# Patient Record
Sex: Male | Born: 1995 | Race: Black or African American | Hispanic: No | Marital: Single | Smoking: Never smoker
Health system: Southern US, Community
[De-identification: ages and names within clinical notes are randomized; demographics above are authoritative.]

## PROBLEM LIST (undated history)

## (undated) DIAGNOSIS — J45909 Unspecified asthma, uncomplicated: Secondary | ICD-10-CM

## (undated) DIAGNOSIS — N289 Disorder of kidney and ureter, unspecified: Secondary | ICD-10-CM

---
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
## 2015-01-20 DIAGNOSIS — I10 Essential (primary) hypertension: Secondary | ICD-10-CM | POA: Insufficient documentation

## 2017-04-06 ENCOUNTER — Encounter (HOSPITAL_COMMUNITY): Payer: Self-pay | Admitting: Emergency Medicine

## 2017-04-06 ENCOUNTER — Emergency Department (HOSPITAL_COMMUNITY): Payer: Medicaid Other

## 2017-04-06 ENCOUNTER — Emergency Department (HOSPITAL_COMMUNITY)
Admission: EM | Admit: 2017-04-06 | Discharge: 2017-04-06 | Disposition: A | Discharge status: Home / Self Care | Payer: Medicaid Other | Attending: Emergency Medicine | Admitting: Emergency Medicine

## 2017-04-06 DIAGNOSIS — J45909 Unspecified asthma, uncomplicated: Secondary | ICD-10-CM | POA: Diagnosis not present

## 2017-04-06 DIAGNOSIS — Z79899 Other long term (current) drug therapy: Secondary | ICD-10-CM | POA: Insufficient documentation

## 2017-04-06 DIAGNOSIS — F17200 Nicotine dependence, unspecified, uncomplicated: Secondary | ICD-10-CM | POA: Diagnosis not present

## 2017-04-06 DIAGNOSIS — R112 Nausea with vomiting, unspecified: Secondary | ICD-10-CM

## 2017-04-06 DIAGNOSIS — R1011 Right upper quadrant pain: Secondary | ICD-10-CM

## 2017-04-06 HISTORY — DX: Disorder of kidney and ureter, unspecified: N28.9

## 2017-04-06 LAB — COMPREHENSIVE METABOLIC PANEL
ALT: 23 U/L (ref 17–63)
AST: 31 U/L (ref 15–41)
Albumin: 4.6 g/dL (ref 3.5–5.0)
Alkaline Phosphatase: 119 U/L (ref 38–126)
Anion gap: 13 (ref 5–15)
BILIRUBIN TOTAL: 1.8 mg/dL — AB (ref 0.3–1.2)
BUN: 17 mg/dL (ref 6–20)
CO2: 23 mmol/L (ref 22–32)
CREATININE: 1.92 mg/dL — AB (ref 0.61–1.24)
Calcium: 10 mg/dL (ref 8.9–10.3)
Chloride: 99 mmol/L — ABNORMAL LOW (ref 101–111)
GFR calc Af Amer: 56 mL/min — ABNORMAL LOW (ref >60)
GFR calc non Af Amer: 49 mL/min — ABNORMAL LOW (ref >60)
Glucose, Bld: 107 mg/dL — ABNORMAL HIGH (ref 65–99)
Potassium: 3.9 mmol/L (ref 3.5–5.1)
Sodium: 135 mmol/L (ref 135–145)
TOTAL PROTEIN: 8.5 g/dL — AB (ref 6.5–8.1)

## 2017-04-06 LAB — URINALYSIS, ROUTINE W REFLEX MICROSCOPIC
BACTERIA UA: NONE SEEN
BILIRUBIN URINE: NEGATIVE
Glucose, UA: NEGATIVE mg/dL
KETONES UR: 20 mg/dL — AB
LEUKOCYTES UA: NEGATIVE
NITRITE: NEGATIVE
Protein, ur: >=300 mg/dL — AB
Specific Gravity, Urine: 1.018 (ref 1.005–1.030)
Squamous Epithelial / LPF: NONE SEEN
pH: 5 (ref 5.0–8.0)

## 2017-04-06 LAB — CBC
HCT: 57.3 % — ABNORMAL HIGH (ref 39.0–52.0)
Hemoglobin: 20.5 g/dL — ABNORMAL HIGH (ref 13.0–17.0)
MCH: 30.7 pg (ref 26.0–34.0)
MCHC: 35.8 g/dL (ref 30.0–36.0)
MCV: 85.8 fL (ref 78.0–100.0)
PLATELETS: 199 10*3/uL (ref 150–400)
RBC: 6.68 MIL/uL — ABNORMAL HIGH (ref 4.22–5.81)
RDW: 13.2 % (ref 11.5–15.5)
WBC: 10.3 10*3/uL (ref 4.0–10.5)

## 2017-04-06 LAB — LIPASE, BLOOD: Lipase: 26 U/L (ref 11–51)

## 2017-04-06 MED ORDER — ONDANSETRON 4 MG PO TBDP: 4.0000 mg | ORAL_TABLET | Freq: Three times a day (TID) | ORAL | 0 refills | Status: DC | PRN

## 2017-04-06 MED ORDER — GI COCKTAIL ~~LOC~~
30.0000 mL | Freq: Once | ORAL | Status: AC
  Administered 2017-04-06: 30 mL via ORAL
  Filled 2017-04-06: qty 30

## 2017-04-06 MED ORDER — LACTATED RINGERS IV BOLUS (SEPSIS)
1000.0000 mL | Freq: Once | INTRAVENOUS | Status: AC
  Administered 2017-04-06: 1000 mL via INTRAVENOUS

## 2017-04-06 MED ORDER — ONDANSETRON HCL 4 MG/2ML IJ SOLN
4.0000 mg | Freq: Once | INTRAMUSCULAR | Status: AC
  Administered 2017-04-06: 4 mg via INTRAVENOUS
  Filled 2017-04-06: qty 2

## 2017-04-06 MED ORDER — FENTANYL CITRATE (PF) 100 MCG/2ML IJ SOLN
100.0000 ug | Freq: Once | INTRAMUSCULAR | Status: AC
  Administered 2017-04-06: 100 ug via INTRAVENOUS
  Filled 2017-04-06: qty 2

## 2017-04-06 MED ORDER — RANITIDINE HCL 150 MG PO CAPS: 150.0000 mg | ORAL_CAPSULE | Freq: Every day | ORAL | 0 refills | Status: DC

## 2017-04-06 NOTE — ED Notes (Signed)
Gave pt a urinal 

## 2017-04-06 NOTE — ED Notes (Signed)
This RN called to consult room by phlebotomist regarding patient status. Patient diaphoretic upon assessment c/o abdominal pain and chills. Vital signs obtained.

## 2017-04-06 NOTE — ED Triage Notes (Signed)
Per EMS, patient c/o abdominal pain with N/V since yesterday morning. Denies chest pain and SOB. Ambulatory with EMS.  BP 146/105 RR 20 CBG 127

## 2017-04-06 NOTE — ED Provider Notes (Signed)
[facility]   CSN: [id number] Arrival date & time: 04/06/17  1458     History   Chief Complaint Chief Complaint  Patient presents with  . Abdominal Pain    HPI [name] is a 21 y.o. male.  Vernona [name] w/ PMH including HTN, renal disorder who p/w abdominal pain and vomiting. The patient began having right upper quadrant abdominal pain yesterday that has been constant since it began. He has had her current episodes of nausea and vomiting since yesterday and states that he has only been able to tolerate ginger ale, has vomiting with any food intake. He denies any radiation of the pain to his shoulder or back. He reports associated chills and was noted to be diaphoretic in triage. He denies any known fevers. No diarrhea or urinary problems. He denies any sick contacts.  The patient reports that he follows with [facility] for his renal issues and states that he is compliant with his medications.   The history is provided by the patient.  Abdominal Pain      Past Medical History:  Diagnosis Date  . Asthma   . Renal disorder     There are no active problems to display for this patient.   History reviewed. No pertinent surgical history.     Home Medications    Prior to Admission medications   Medication Sig Start Date End Date Taking? Authorizing Provider  losartan (COZAAR) 50 MG tablet Take 1 tablet by mouth daily. 12/23/14  Yes [provider]  Vitamin D, Ergocalciferol, (DRISDOL) 50000 units CAPS capsule Take 1 capsule by mouth once a week. 03/04/17  Yes [provider]  ondansetron (ZOFRAN ODT) 4 MG disintegrating tablet Take 1 tablet (4 mg total) by mouth every 8 (eight) hours as needed for nausea or vomiting. 04/06/17   Edom Schmuhl, Ambrose Finland, MD  ondansetron (ZOFRAN) 4 MG tablet Take 1 tablet (4 mg total) by mouth every 6 (six) hours. Patient not taking: Reported on 04/06/2017 09/23/14   Phillis Haggis, MD  ranitidine  (ZANTAC) 150 MG capsule Take 1 capsule (150 mg total) by mouth daily. 04/06/17   Nicolaas Savo, Ambrose Finland, MD    Family History No family history on file.  Social History Social History  Substance Use Topics  . Smoking status: Current Every Day Smoker  . Smokeless tobacco: Not on file  . Alcohol use No     Allergies   Amoxicillin   Review of Systems Review of Systems  Gastrointestinal: Positive for abdominal pain.   All other systems reviewed and are negative except that which was mentioned in HPI   Physical Exam Updated Vital Signs BP (!) 155/111 (BP Location: Right Arm)   Pulse 88   Temp 98.3 F (36.8 C) (Oral)   Resp 18   SpO2 98%   Physical Exam  Constitutional: He is oriented to person, place, and time. He appears well-developed and well-nourished. No distress.  HENT:  Head: Normocephalic and atraumatic.  Moist mucous membranes  Eyes: Pupils are equal, round, and reactive to light. Conjunctivae are normal.  Neck: Neck supple.  Cardiovascular: Normal rate, regular rhythm and normal heart sounds.   No murmur heard. Pulmonary/Chest: Effort normal and breath sounds normal.  Abdominal: Soft. Bowel sounds are normal. He exhibits no distension. There is tenderness (RUQ). There is no rebound, no guarding and negative Murphy's sign.  Musculoskeletal: He exhibits no edema.  Neurological: He is alert and oriented to person, place, and time.  Fluent speech  Skin: Skin is warm. He is diaphoretic.  Psychiatric: He has a normal mood and affect. Judgment normal.  Nursing note and vitals reviewed.    ED Treatments / Results  Labs (all labs ordered are listed, but only abnormal results are displayed) Labs Reviewed  COMPREHENSIVE METABOLIC PANEL - Abnormal; Notable for the following:       Result Value   Chloride 99 (*)    Glucose, Bld 107 (*)    Creatinine, Ser 1.92 (*)    Total Protein 8.5 (*)    Total Bilirubin 1.8 (*)    GFR calc non Af Amer 49 (*)    GFR calc Af  Amer 56 (*)    All other components within normal limits  CBC - Abnormal; Notable for the following:    RBC 6.68 (*)    Hemoglobin 20.5 (*)    HCT 57.3 (*)    All other components within normal limits  URINALYSIS, ROUTINE W REFLEX MICROSCOPIC - Abnormal; Notable for the following:    Hgb urine dipstick SMALL (*)    Ketones, ur 20 (*)    Protein, ur >=300 (*)    All other components within normal limits  LIPASE, BLOOD    EKG  EKG Interpretation None       Radiology US Abdomen Limited Ruq  Result Date: 04/06/2017 CLINICAL DATA:  Right upper quadrant pain EXAM: ULTRASOUND ABDOMEN LIMITED RIGHT UPPER QUADRANT COMPARISON:  11/28/2014 FINDINGS: Gallbladder: No gallstones or wall thickening visualized. No sonographic Murphy sign noted by sonographer. Common bile duct: Diameter: 2.7 mm Liver: No focal lesion identified. Within normal limits in parenchymal echogenicity. Portal vein is patent on color Doppler imaging with normal direction of blood flow towards the liver. Incidental note made of echogenic right kidney IMPRESSION: 1. Negative for gallstones or biliary dilatation 2. Incidental note made of echogenic right kidney, could be due to pyelonephritis or medical renal disease. Could evaluate with dedicated renal ultrasound Electronically Signed   By: Jasmine Pang M.D.   On: 04/06/2017 20:39    Procedures Procedures (including critical care time)  Medications Ordered in ED Medications  lactated ringers bolus 1,000 mL (0 mLs Intravenous Stopped 04/06/17 2045)  ondansetron (ZOFRAN) injection 4 mg (4 mg Intravenous Given 04/06/17 1908)  fentaNYL (SUBLIMAZE) injection 100 mcg (100 mcg Intravenous Given 04/06/17 1910)  gi cocktail (Maalox,Lidocaine,Donnatal) (30 mLs Oral Given 04/06/17 2112)     Initial Impression / Assessment and Plan / ED Course  I have reviewed the triage vital signs and the nursing notes.  Pertinent labs & imaging results that were available during my care of the  patient were reviewed by me and considered in my medical decision making (see chart for details).     Pt w/ RUQ pain and N/V since last night. He was diaphoretic but non-toxic on exam, VS notable for mild tachycardia but afebrile. Focal RUQ tenderness on exam. Labs show creatinine 1.92 which is mildly worse than previous labs from 2016, no recent for comparison. AST/ALT normal, total bilirubin elevated at 1.8. His white count is normal, he does have hemoglobin of 20.5 which is similar to elevated values previously. Obtained RUQ [place] which was unremarkable.  On repeat exam after receiving above medications including GI cocktail, the patient stated that his pain had completely resolved. He was well-appearing and had been able to tolerate liquids, wanted to go home. I did start him on H2 blocker and discussed f/u with PCP regarding sx. reviewed return precautions including  worsening pain, vomiting, fever, or bloody stools. Patient voiced understanding and was discharged in satisfactory condition. Final Clinical Impressions(s) / ED Diagnoses   Final diagnoses:  RUQ pain  Right upper quadrant abdominal pain  Non-intractable vomiting with nausea, unspecified vomiting type    New Prescriptions Discharge Medication List as of 04/06/2017 10:16 PM    START taking these medications   Details  ondansetron (ZOFRAN ODT) 4 MG disintegrating tablet Take 1 tablet (4 mg total) by mouth every 8 (eight) hours as needed for nausea or vomiting., Starting Thu 04/06/2017, Print    ranitidine (ZANTAC) 150 MG capsule Take 1 capsule (150 mg total) by mouth daily., Starting Thu 04/06/2017, Print         Caroll Weinheimer, Ambrose Finland, MD 04/08/17 [facility]ers

## 2017-08-11 ENCOUNTER — Encounter (HOSPITAL_COMMUNITY): Payer: Self-pay | Admitting: Emergency Medicine

## 2017-08-11 ENCOUNTER — Emergency Department (HOSPITAL_COMMUNITY)
Admission: EM | Admit: 2017-08-11 | Discharge: 2017-08-11 | Disposition: A | Discharge status: Home / Self Care | Payer: Medicaid Other | Attending: Emergency Medicine | Admitting: Emergency Medicine

## 2017-08-11 ENCOUNTER — Other Ambulatory Visit: Payer: Self-pay

## 2017-08-11 DIAGNOSIS — R809 Proteinuria, unspecified: Secondary | ICD-10-CM | POA: Insufficient documentation

## 2017-08-11 DIAGNOSIS — F17210 Nicotine dependence, cigarettes, uncomplicated: Secondary | ICD-10-CM | POA: Diagnosis not present

## 2017-08-11 DIAGNOSIS — R1013 Epigastric pain: Secondary | ICD-10-CM | POA: Insufficient documentation

## 2017-08-11 DIAGNOSIS — Z79899 Other long term (current) drug therapy: Secondary | ICD-10-CM | POA: Insufficient documentation

## 2017-08-11 DIAGNOSIS — R110 Nausea: Secondary | ICD-10-CM | POA: Insufficient documentation

## 2017-08-11 LAB — CBC WITH DIFFERENTIAL/PLATELET
BASOS ABS: 0 10*3/uL (ref 0.0–0.1)
BASOS PCT: 0 %
EOS ABS: 0.1 10*3/uL (ref 0.0–0.7)
EOS PCT: 1 %
HCT: 52 % (ref 39.0–52.0)
Hemoglobin: 18.9 g/dL — ABNORMAL HIGH (ref 13.0–17.0)
Lymphocytes Relative: 37 %
Lymphs Abs: 2.9 10*3/uL (ref 0.7–4.0)
MCH: 31 pg (ref 26.0–34.0)
MCHC: 36.3 g/dL — ABNORMAL HIGH (ref 30.0–36.0)
MCV: 85.2 fL (ref 78.0–100.0)
MONO ABS: 0.5 10*3/uL (ref 0.1–1.0)
Monocytes Relative: 7 %
NEUTROS ABS: 4.2 10*3/uL (ref 1.7–7.7)
Neutrophils Relative %: 55 %
Platelets: 178 10*3/uL (ref 150–400)
RBC: 6.1 MIL/uL — ABNORMAL HIGH (ref 4.22–5.81)
RDW: 13.6 % (ref 11.5–15.5)
WBC: 7.8 10*3/uL (ref 4.0–10.5)

## 2017-08-11 LAB — COMPREHENSIVE METABOLIC PANEL
ALBUMIN: 4 g/dL (ref 3.5–5.0)
ALT: 25 U/L (ref 17–63)
AST: 26 U/L (ref 15–41)
Alkaline Phosphatase: 100 U/L (ref 38–126)
Anion gap: 6 (ref 5–15)
BUN: 12 mg/dL (ref 6–20)
CO2: 25 mmol/L (ref 22–32)
Calcium: 9.2 mg/dL (ref 8.9–10.3)
Chloride: 107 mmol/L (ref 101–111)
Creatinine, Ser: 1.47 mg/dL — ABNORMAL HIGH (ref 0.61–1.24)
GFR calc Af Amer: >60 mL/min (ref >60)
GFR calc non Af Amer: >60 mL/min (ref >60)
Glucose, Bld: 91 mg/dL (ref 65–99)
POTASSIUM: 3.9 mmol/L (ref 3.5–5.1)
SODIUM: 138 mmol/L (ref 135–145)
Total Bilirubin: 0.5 mg/dL (ref 0.3–1.2)
Total Protein: 7.3 g/dL (ref 6.5–8.1)

## 2017-08-11 LAB — LIPASE, BLOOD: LIPASE: 44 U/L (ref 11–51)

## 2017-08-11 LAB — URINALYSIS, ROUTINE W REFLEX MICROSCOPIC
BILIRUBIN URINE: NEGATIVE
Bacteria, UA: NONE SEEN
Glucose, UA: NEGATIVE mg/dL
HGB URINE DIPSTICK: NEGATIVE
Ketones, ur: NEGATIVE mg/dL
LEUKOCYTES UA: NEGATIVE
NITRITE: NEGATIVE
PH: 6 (ref 5.0–8.0)
Protein, ur: 100 mg/dL — AB
SQUAMOUS EPITHELIAL / LPF: NONE SEEN
Specific Gravity, Urine: 1.015 (ref 1.005–1.030)

## 2017-08-11 LAB — RAPID URINE DRUG SCREEN, HOSP PERFORMED
Amphetamines: NOT DETECTED
BARBITURATES: NOT DETECTED
Benzodiazepines: NOT DETECTED
COCAINE: NOT DETECTED
Opiates: NOT DETECTED
Tetrahydrocannabinol: POSITIVE — AB

## 2017-08-11 MED ORDER — SODIUM CHLORIDE 0.9 % IV BOLUS (SEPSIS)
1000.0000 mL | Freq: Once | INTRAVENOUS | Status: AC
  Administered 2017-08-11: 1000 mL via INTRAVENOUS

## 2017-08-11 MED ORDER — ONDANSETRON 4 MG PO TBDP: 4.0000 mg | ORAL_TABLET | Freq: Three times a day (TID) | ORAL | 0 refills | Status: DC | PRN

## 2017-08-11 MED ORDER — LOSARTAN POTASSIUM 50 MG PO TABS
50.0000 mg | ORAL_TABLET | Freq: Once | ORAL | Status: AC
  Administered 2017-08-11: 50 mg via ORAL
  Filled 2017-08-11: qty 1

## 2017-08-11 MED ORDER — PANTOPRAZOLE SODIUM 40 MG PO TBEC
40.0000 mg | DELAYED_RELEASE_TABLET | Freq: Once | ORAL | Status: AC
  Administered 2017-08-11: 40 mg via ORAL
  Filled 2017-08-11: qty 1

## 2017-08-11 MED ORDER — RANITIDINE HCL 150 MG PO CAPS: 150.0000 mg | ORAL_CAPSULE | Freq: Every day | ORAL | 0 refills | Status: DC

## 2017-08-11 MED ORDER — GI COCKTAIL ~~LOC~~
30.0000 mL | Freq: Once | ORAL | Status: AC
  Administered 2017-08-11: 30 mL via ORAL
  Filled 2017-08-11: qty 30

## 2017-08-11 NOTE — ED Provider Notes (Signed)
[facility]-EMERGENCY DEPT Provider Note   CSN: [id number] Arrival date & time: 08/11/17  0058     History   Chief Complaint Chief Complaint  Patient presents with  . Abdominal Pain    HPI [name] is a 22 y.o. male with a hx of asthma presents to the Emergency Department complaining of [name], [name], [name] reports pain is sharp and constant.  [name] reports breathing sometimes makes it worse and nothing makes it better.  [name] has tried different positions, but has not taken any medications for this.  He does take Losartan daily (except for today).  [name] denies vomiting, diarrhea, weakness, dizziness, syncope, fever, chills, dysuria, hematuria.   He denies recent travel, abd surgeries or sick contacts.  Patient reports he does regularly smoke marijuana.   The history is provided by the patient and medical records. No language interpreter was used.    Past Medical History:  Diagnosis Date  . Asthma   . Renal disorder     There are no active problems to display for this patient.   History reviewed. No pertinent surgical history.     Home Medications    Prior to Admission medications   Medication Sig Start Date End Date Taking? Authorizing Provider  losartan (COZAAR) 50 MG tablet Take 1 tablet by mouth daily. 12/23/14  Yes [provider]  Vitamin D, Ergocalciferol, (DRISDOL) 50000 units CAPS capsule Take 1 capsule by mouth once a week. 03/04/17  Yes [provider]  ondansetron (ZOFRAN ODT) 4 MG disintegrating tablet Take 1 tablet (4 mg total) by mouth every 8 (eight) hours as needed for nausea or vomiting. 08/11/17   Champ Keetch, Dahlia Client, PA-C  ranitidine (ZANTAC) 150 MG capsule Take 1 capsule (150 mg total) by mouth daily. 08/11/17   Srihith Aquilino, Dahlia Client, PA-C    Family History No family history on file.  Social History Social History   Tobacco Use  . Smoking status:  Current Every Day Smoker  Substance Use Topics  . Alcohol use: No  . Drug use: No     Allergies   Amoxicillin   Review of Systems Review of Systems  Constitutional: Negative for appetite change, diaphoresis, fatigue, fever and unexpected weight change.  HENT: Negative for mouth sores.   Eyes: Negative for visual disturbance.  Respiratory: Negative for cough, chest tightness, shortness of breath and wheezing.   Cardiovascular: Negative for chest pain.  Gastrointestinal: Positive for abdominal pain and nausea. Negative for constipation, diarrhea and vomiting.  Endocrine: Negative for polydipsia, polyphagia and polyuria.  Genitourinary: Negative for dysuria, frequency, hematuria and urgency.  Musculoskeletal: Negative for back pain and neck stiffness.  Skin: Negative for rash.  Allergic/Immunologic: Negative for immunocompromised state.  Neurological: Negative for syncope, light-headedness and headaches.  Hematological: Does not bruise/bleed easily.  Psychiatric/Behavioral: Negative for sleep disturbance. The patient is not nervous/anxious.      Physical Exam Updated Vital Signs BP (!) 171/120   Pulse 98   Resp 18   SpO2 100%   Physical Exam  Constitutional: He appears well-developed and well-nourished. No distress.  Awake, alert, nontoxic appearance  HENT:  Head: Normocephalic and atraumatic.  Mouth/Throat: Oropharynx is clear and moist. No oropharyngeal exudate.  Eyes: Conjunctivae are normal. No scleral icterus.  Neck: Normal range of motion. Neck supple.  Cardiovascular: Normal rate, regular rhythm and intact distal pulses.  Pulmonary/Chest: Effort normal and breath sounds normal. No respiratory distress. He has no wheezes.  Equal chest expansion  Abdominal: Soft. Bowel sounds are normal. He exhibits no mass. There is tenderness in the epigastric area. There is no rigidity, no rebound, no guarding, no CVA tenderness, no tenderness at McBurney's point and negative  Murphy's sign.  Musculoskeletal: Normal range of motion. He exhibits no edema.  Neurological: He is alert.  Speech is clear and goal oriented Moves extremities without ataxia  Skin: Skin is warm and dry. He is not diaphoretic.  Psychiatric: He has a normal mood and affect.  Nursing note and vitals reviewed.    ED Treatments / Results  Labs (all labs ordered are listed, but only abnormal results are displayed) Labs Reviewed  CBC WITH DIFFERENTIAL/PLATELET - Abnormal; Notable for the following components:      Result Value   RBC 6.10 (*)    Hemoglobin 18.9 (*)    MCHC 36.3 (*)    All other components within normal limits  COMPREHENSIVE METABOLIC PANEL - Abnormal; Notable for the following components:   Creatinine, Ser 1.47 (*)    All other components within normal limits  URINALYSIS, ROUTINE W REFLEX MICROSCOPIC - Abnormal; Notable for the following components:   Protein, ur 100 (*)    All other components within normal limits  RAPID URINE DRUG SCREEN, HOSP PERFORMED - Abnormal; Notable for the following components:   Tetrahydrocannabinol POSITIVE (*)    All other components within normal limits  LIPASE, BLOOD     Procedures Procedures (including critical care time)  Medications Ordered in ED Medications  gi cocktail (Maalox,Lidocaine,Donnatal) (30 mLs Oral Given 08/11/17 0456)  pantoprazole (PROTONIX) EC tablet 40 mg (40 mg Oral Given 08/11/17 0456)  sodium chloride 0.9 % bolus 1,000 mL (1,000 mLs Intravenous New Bag/Given 08/11/17 0456)  losartan (COZAAR) tablet 50 mg (50 mg Oral Given 08/11/17 0543)     Initial Impression / Assessment and Plan / ED Course  I have reviewed the triage vital signs and the nursing notes.  Pertinent labs & imaging results that were available during my care of the patient were reviewed by me and considered in my medical decision making (see chart for details).  Clinical Course as of Aug 11 622  Fri Aug 11, 2017  0536 Tetrahydrocannabinol: (!)  POSITIVE [HM]  0536 Baseline Creatinine: (!) 1.47 [HM]  0536 Baseline Hemoglobin: (!) 18.9 [HM]  0536 Proteinuria noted. Protein: (!) 100 [HM]    Clinical Course User Index [HM] Marcel Sorter, Dahlia ClientHannah, PA-C    Patient with symptoms consistent with gastritis.  Likely viral vs cannabinoid in nature.  Vitals are stable, no fever or tachycardia.  Patient is nontoxic, nonseptic appearing, in no apparent distress.  Patient does not meet the SIRS or Sepsis criteria.  [name]'s symptoms have been managed in the department; fluid bolus given.  Patient with some proteinuria noted.  He reports this is baseline.  No signs of dehydration, tolerating PO fluids > 6 oz.  Lungs are clear.  No peritoneal signs, no concern for appendicitis, cholecystitis, pancreatitis, ruptured viscus, UTI, kidney stone.  Supportive therapy indicated.  Patient counseled, expresses understanding and agrees with plan.   BP (!) 173/117   Pulse (!) 101   Resp 18   SpO2 100%    Final Clinical Impressions(s) / ED Diagnoses   Final diagnoses:  Epigastric pain  Proteinuria, unspecified type    ED Discharge Orders        Ordered    ondansetron (ZOFRAN ODT) 4 MG disintegrating tablet  Every 8 hours PRN  08/11/17 0611    ranitidine (ZANTAC) 150 MG capsule  Daily     08/11/17 0611       Tariq Pernell, Dahlia Client, PA-C 08/11/17 0625    Molpus, Jonny Ruiz, MD 08/11/17 [phone]

## 2017-08-11 NOTE — ED Notes (Signed)
Pt stated that he has not taken his BP medication today. He does have a hx of HTN and is on Losartan.

## 2017-08-11 NOTE — Discharge Instructions (Signed)
1. Medications: Zofran, Zantac, usual home medications including BP meds 2. Treatment: rest, drink plenty of fluids, advance diet slowly 3. Follow Up: Please followup with your primary doctor in 2 days for discussion of your diagnoses and further evaluation after today's visit and repeat urinalysis; if you do not have a primary care doctor use the resource guide provided to find one; Please return to the ER for persistent vomiting, high fevers or worsening symptoms

## 2017-08-11 NOTE — ED Triage Notes (Signed)
Pt arriving with [facility] with complaint of upper abdominal pain x2 days. Denies N/V/D. Pt states the pain increases when he inhales and when the area is palpated.

## 2018-07-30 ENCOUNTER — Encounter (HOSPITAL_COMMUNITY): Payer: Self-pay | Admitting: *Deleted

## 2018-07-30 ENCOUNTER — Emergency Department (HOSPITAL_COMMUNITY)
Admission: EM | Admit: 2018-07-30 | Discharge: 2018-07-30 | Disposition: A | Discharge status: Home / Self Care | Payer: Medicaid Other | Attending: Emergency Medicine | Admitting: Emergency Medicine

## 2018-07-30 ENCOUNTER — Emergency Department (HOSPITAL_COMMUNITY): Payer: Medicaid Other

## 2018-07-30 DIAGNOSIS — F17210 Nicotine dependence, cigarettes, uncomplicated: Secondary | ICD-10-CM | POA: Diagnosis not present

## 2018-07-30 DIAGNOSIS — Z79899 Other long term (current) drug therapy: Secondary | ICD-10-CM | POA: Insufficient documentation

## 2018-07-30 DIAGNOSIS — M545 Low back pain, unspecified: Secondary | ICD-10-CM

## 2018-07-30 DIAGNOSIS — B349 Viral infection, unspecified: Secondary | ICD-10-CM | POA: Diagnosis not present

## 2018-07-30 DIAGNOSIS — R05 Cough: Secondary | ICD-10-CM | POA: Diagnosis present

## 2018-07-30 DIAGNOSIS — J439 Emphysema, unspecified: Secondary | ICD-10-CM | POA: Insufficient documentation

## 2018-07-30 DIAGNOSIS — J45909 Unspecified asthma, uncomplicated: Secondary | ICD-10-CM | POA: Diagnosis not present

## 2018-07-30 LAB — COMPREHENSIVE METABOLIC PANEL
ALK PHOS: 107 U/L (ref 38–126)
ALT: 25 U/L (ref 0–44)
AST: 30 U/L (ref 15–41)
Albumin: 4.1 g/dL (ref 3.5–5.0)
Anion gap: 9 (ref 5–15)
BUN: 13 mg/dL (ref 6–20)
CALCIUM: 9.4 mg/dL (ref 8.9–10.3)
CO2: 24 mmol/L (ref 22–32)
CREATININE: 1.67 mg/dL — AB (ref 0.61–1.24)
Chloride: 105 mmol/L (ref 98–111)
GFR, EST NON AFRICAN AMERICAN: 57 mL/min — AB (ref >60)
Glucose, Bld: 74 mg/dL (ref 70–99)
Potassium: 3.9 mmol/L (ref 3.5–5.1)
Sodium: 138 mmol/L (ref 135–145)
Total Bilirubin: 1.1 mg/dL (ref 0.3–1.2)
Total Protein: 7.5 g/dL (ref 6.5–8.1)

## 2018-07-30 LAB — CBC WITH DIFFERENTIAL/PLATELET
ABS IMMATURE GRANULOCYTES: 0.01 10*3/uL (ref 0.00–0.07)
BASOS PCT: 1 %
Basophils Absolute: 0.1 10*3/uL (ref 0.0–0.1)
Eosinophils Absolute: 0 10*3/uL (ref 0.0–0.5)
Eosinophils Relative: 0 %
HEMATOCRIT: 58 % — AB (ref 39.0–52.0)
HEMOGLOBIN: 19.2 g/dL — AB (ref 13.0–17.0)
IMMATURE GRANULOCYTES: 0 %
LYMPHS PCT: 13 %
Lymphs Abs: 0.9 10*3/uL (ref 0.7–4.0)
MCH: 29.4 pg (ref 26.0–34.0)
MCHC: 33.1 g/dL (ref 30.0–36.0)
MCV: 88.7 fL (ref 80.0–100.0)
MONO ABS: 1 10*3/uL (ref 0.1–1.0)
MONOS PCT: 16 %
NEUTROS ABS: 4.4 10*3/uL (ref 1.7–7.7)
NEUTROS PCT: 70 %
Platelets: 221 10*3/uL (ref 150–400)
RBC: 6.54 MIL/uL — AB (ref 4.22–5.81)
RDW: 13.1 % (ref 11.5–15.5)
WBC: 6.4 10*3/uL (ref 4.0–10.5)
nRBC: 0 % (ref 0.0–0.2)

## 2018-07-30 LAB — LIPASE, BLOOD: LIPASE: 42 U/L (ref 11–51)

## 2018-07-30 MED ORDER — SODIUM CHLORIDE 0.9 % IV BOLUS
1000.0000 mL | Freq: Once | INTRAVENOUS | Status: AC
  Administered 2018-07-30: 1000 mL via INTRAVENOUS

## 2018-07-30 MED ORDER — AEROCHAMBER PLUS FLO-VU MEDIUM MISC
1.0000 | Freq: Once | Status: AC
  Administered 2018-07-30: 1
  Filled 2018-07-30: qty 1

## 2018-07-30 MED ORDER — ACETAMINOPHEN 500 MG PO TABS
1000.0000 mg | ORAL_TABLET | Freq: Once | ORAL | Status: AC
  Administered 2018-07-30: 1000 mg via ORAL
  Filled 2018-07-30: qty 2

## 2018-07-30 MED ORDER — NAPROXEN 500 MG PO TABS: 500.0000 mg | ORAL_TABLET | Freq: Once | ORAL | Status: DC

## 2018-07-30 MED ORDER — METHOCARBAMOL 500 MG PO TABS: 500.0000 mg | ORAL_TABLET | Freq: Every evening | ORAL | 0 refills | Status: DC | PRN

## 2018-07-30 MED ORDER — ALBUTEROL SULFATE HFA 108 (90 BASE) MCG/ACT IN AERS
1.0000 | INHALATION_SPRAY | Freq: Once | RESPIRATORY_TRACT | Status: AC
  Administered 2018-07-30: 1 via RESPIRATORY_TRACT
  Filled 2018-07-30: qty 6.7

## 2018-07-30 NOTE — ED Notes (Signed)
Called lab about status of pts blood. Per lab: they just found blood and running the sample at this time. Pts blood collected and sent to lab at 1754

## 2018-07-30 NOTE — ED Provider Notes (Signed)
[facility]-EMERGENCY DEPT Provider Note   CSN: [id number] Arrival date & time: 07/30/18  1520     History   Chief Complaint Chief Complaint  Patient presents with  . Abdominal Pain    HPI [name] is a 23 y.o. male presenting for evaluation of back pain, cough, nausea.  Patient states 2 weeks ago, he developed cough, sore throat, nasal congestion.  Since then, he has had continued nausea with 2 episodes of vomiting.  Additionally, 2 weeks ago he bent forward to pick something up at work, and he had acute onset low back pain.  This has been persistent since.  Patient states today he got very anxious about what was going on, felt like his heart was racing and fell onto the couch.  He denies full loss of consciousness.  Patient denies tobacco, alcohol, or drug use.  He has not taken anything for his symptoms including Tylenol or ibuprofen.  Patient reports a history of kidney problems, for which he is supposed to be taking losartan daily, but does not.  Patient states he has no other medical problems, takes no medications daily.  Patient reports his back pain is present when he moves, specifically bending forward to pick something up.  He denies radiation to his legs.  He denies fevers, chills, rash, history of cancer, history of IVDU, numbness, tingling, or loss of bowel bladder control.  He denies urinary symptoms including dysuria, hematuria, urinary frequency.  HPI  Past Medical History:  Diagnosis Date  . Asthma   . Renal disorder     There are no active problems to display for this patient.   History reviewed. No pertinent surgical history.      Home Medications    Prior to Admission medications   Medication Sig Start Date End Date Taking? Authorizing Provider  losartan (COZAAR) 50 MG tablet Take 1 tablet by mouth once a week.  12/23/14  Yes [provider]  Vitamin D, Ergocalciferol, (DRISDOL) 50000 units CAPS capsule Take 1 capsule by  mouth once a week. 03/04/17  Yes [provider]  methocarbamol (ROBAXIN) 500 MG tablet Take 1 tablet (500 mg total) by mouth at bedtime as needed for muscle spasms. 07/30/18   Wetona Viramontes, PA-C  ondansetron (ZOFRAN ODT) 4 MG disintegrating tablet Take 1 tablet (4 mg total) by mouth every 8 (eight) hours as needed for nausea or vomiting. Patient not taking: Reported on 07/30/2018 08/11/17   Muthersbaugh, Dahlia Client, PA-C  ranitidine (ZANTAC) 150 MG capsule Take 1 capsule (150 mg total) by mouth daily. Patient not taking: Reported on 07/30/2018 08/11/17   Muthersbaugh, Dahlia Client, PA-C    Family History No family history on file.  Social History Social History   Tobacco Use  . Smoking status: Current Every Day Smoker  Substance Use Topics  . Alcohol use: No  . Drug use: No     Allergies   Amoxicillin   Review of Systems Review of Systems  HENT: Positive for congestion (improved) and sore throat (improved).   Respiratory: Positive for cough.   Gastrointestinal: Positive for nausea and vomiting (x2 ).  Musculoskeletal: Positive for back pain.  Psychiatric/Behavioral: The patient is nervous/anxious.   All other systems reviewed and are negative.    Physical Exam Updated Vital Signs BP (!) 141/93 (BP Location: Left Arm)   Pulse 86   Temp 98.1 F (36.7 C) (Oral)   Resp 17   SpO2 99%   Physical Exam Vitals signs and nursing note  reviewed.  Constitutional:      General: He is not in acute distress.    Appearance: He is well-developed.     Comments: Sitting comfortably in the bed in no acute distress  HENT:     Head: Normocephalic and atraumatic.     Comments: MM dry.  OP clear without tonsillar swelling exudate.  Uvula midline with palate rise.  TMs nonerythematous nonbulging bilaterally.    Mouth/Throat:     Mouth: Mucous membranes are dry.  Eyes:     Conjunctiva/sclera: Conjunctivae normal.     Pupils: Pupils are equal, round, and reactive to light.  Neck:      Musculoskeletal: Normal range of motion and neck supple.  Cardiovascular:     Rate and Rhythm: Normal rate and regular rhythm.     Pulses: Normal pulses.  Pulmonary:     Effort: Pulmonary effort is normal. No respiratory distress.     Breath sounds: Normal breath sounds. No wheezing.     Comments: Speaking in full sentences.  Clear lung sounds in all fields.  Nonproductive cough noted on exam. Abdominal:     General: There is no distension.     Palpations: Abdomen is soft. There is no mass.     Tenderness: There is no abdominal tenderness. There is no guarding or rebound.  Musculoskeletal: Normal range of motion.        General: Tenderness present.     Comments: Strength of upper and lower extremities intact bilaterally.  Radial pedal pulses intact bilaterally.  No pain with straight leg raise.  Patient able to turn in the bed without signs of pain.  No saddle paresthesias. Tenderness palpation bilateral low back musculature without increased pain over midline spine.  No step-offs or deformities.  Skin:    General: Skin is warm and dry.     Capillary Refill: Capillary refill takes less than 2 seconds.  Neurological:     Mental Status: He is alert and oriented to person, place, and time.      ED Treatments / Results  Labs (all labs ordered are listed, but only abnormal results are displayed) Labs Reviewed  CBC WITH DIFFERENTIAL/PLATELET - Abnormal; Notable for the following components:      Result Value   RBC 6.54 (*)    Hemoglobin 19.2 (*)    HCT 58.0 (*)    All other components within normal limits  COMPREHENSIVE METABOLIC PANEL - Abnormal; Notable for the following components:   Creatinine, Ser 1.67 (*)    GFR calc non Af Amer 57 (*)    All other components within normal limits  LIPASE, BLOOD    EKG None  Radiology Dg Chest 2 View  Result Date: 07/30/2018 CLINICAL DATA:  Cough. EXAM: CHEST - 2 VIEW COMPARISON:  06/27/2011 FINDINGS: The heart size and mediastinal  contours are within normal limits. The lungs appear hyperinflated but clear. Coarsened interstitial markings of emphysema noted. The visualized skeletal structures are unremarkable. IMPRESSION: 1. No acute cardiopulmonary abnormalities. 2.  Emphysema (ICD10-J43.9). Electronically Signed   By: Signa Kell M.D.   On: 07/30/2018 17:51    Procedures Procedures (including critical care time)  Medications Ordered in ED Medications  naproxen (NAPROSYN) tablet 500 mg (has no administration in time range)  sodium chloride 0.9 % bolus 1,000 mL (0 mLs Intravenous Stopped 07/30/18 1933)  acetaminophen (TYLENOL) tablet 1,000 mg (1,000 mg Oral Given 07/30/18 1931)  albuterol (PROVENTIL HFA;VENTOLIN HFA) 108 (90 Base) MCG/ACT inhaler 1 puff (1 puff Inhalation  Given 07/30/18 1933)  AEROCHAMBER PLUS FLO-VU MEDIUM MISC 1 each (1 each Other Given 07/30/18 1933)     Initial Impression / Assessment and Plan / ED Course  I have reviewed the triage vital signs and the nursing notes.  Pertinent labs & imaging results that were available during my care of the patient were reviewed by me and considered in my medical decision making (see chart for details).     Patient presenting for evaluation 2-week history of low back pain, cough, nasal congestion, nausea.  Physical examination, he is afebrile not tachycardic.  Appears nontoxic.  Back pain is reproducible with palpation of the musculature, no red flags of back pain.  No signs of neurologic deficit.  Low suspicion for vertebral injury, infection, spinal cord compression, myelopathy, cauda equina syndrome.  Likely MSK, exacerbated by possible viral illness.  Patient's other symptoms are consistent with URI, consider bronchitis as most symptoms are improving except the cough.  As symptoms have been present for 2 weeks, will obtain labs, chest x-ray, and reassess.  Labs without leukocytosis.  Hemoglobin elevated, as is patient's baseline.  Also consistent with  dehydration.  Creatinine is 1.67, around patient's baseline.  Otherwise, labs are reassuring.  Discussed findings with patient, importance of hydration.  Chest x-ray viewed interpreted by me, no pneumonia, pneumothorax, or effusion.  Patient does have emphysema noted on the x-ray, no history of previous.  Discussed findings with patient.  Will give albuterol for cough, and encourage follow-up with pulmonology.  Will give Tylenol and muscle relaxers as needed for back pain.  Encourage follow-up with PCP as needed for further evaluation of his symptoms.  At this time, patient appears safe for discharge.  Return precautions given.  Patient states he understands and agrees to plan.   Final Clinical Impressions(s) / ED Diagnoses   Final diagnoses:  Viral illness  Acute bilateral low back pain without sciatica  Pulmonary emphysema, unspecified emphysema type [facility](HCC)    ED Discharge Orders         Ordered    methocarbamol (ROBAXIN) 500 MG tablet  At bedtime PRN     07/30/18 1942           Alveria ApleyCaccavale, Berenize Gatlin, PA-C 07/30/18 2128    Linwood DibblesKnapp, Jon, MD 08/01/18 1318

## 2018-07-30 NOTE — Discharge Instructions (Signed)
You likely have a virus which is making your back pain worse. This is causing your cough and pain all over.  Use tylenol (1000mg , 2 extra strength pills) 3 times a day for pain control.  Use robaxin as needed for muscle/back pain. Have caution, it may make you tired or groggy. Do not drive while taking this medicine.  Use the inhaler every 4 hours for the next 2 days, after that only as needed for coughing or shortness of breath.  Your xray today showed emphysema. This is uncommon in someone your age, and should be followed up with your primary care doctor and/or a lung doctor.  Make sure you are staying well hydrated with water.  Use heating pads/ice to help with your back pain, do the stretches listed in the paperwork.  If pain or symptoms persist, follow up with your primary care doctor.  Return to the ER with any new, worsening, or concerning symptoms.

## 2018-07-30 NOTE — ED Triage Notes (Signed)
Per EMS, pt complains of upper abdominal pain, bilateral flank pain, and vomiting x 2 weeks. Pt states he lifted something heavy a couple of weeks ago prior to symptoms starting.   BP 138/72 HR 70 RR 18

## 2018-08-02 ENCOUNTER — Emergency Department (HOSPITAL_COMMUNITY)
Admission: EM | Admit: 2018-08-02 | Discharge: 2018-08-02 | Disposition: A | Discharge status: Home / Self Care | Payer: Medicaid Other | Attending: Emergency Medicine | Admitting: Emergency Medicine

## 2018-08-02 ENCOUNTER — Encounter (HOSPITAL_COMMUNITY): Payer: Self-pay

## 2018-08-02 DIAGNOSIS — J45909 Unspecified asthma, uncomplicated: Secondary | ICD-10-CM | POA: Insufficient documentation

## 2018-08-02 DIAGNOSIS — R112 Nausea with vomiting, unspecified: Secondary | ICD-10-CM | POA: Diagnosis not present

## 2018-08-02 DIAGNOSIS — Z79899 Other long term (current) drug therapy: Secondary | ICD-10-CM | POA: Insufficient documentation

## 2018-08-02 DIAGNOSIS — F17200 Nicotine dependence, unspecified, uncomplicated: Secondary | ICD-10-CM | POA: Diagnosis not present

## 2018-08-02 DIAGNOSIS — R1013 Epigastric pain: Secondary | ICD-10-CM

## 2018-08-02 DIAGNOSIS — R1110 Vomiting, unspecified: Secondary | ICD-10-CM | POA: Diagnosis present

## 2018-08-02 LAB — CBC WITH DIFFERENTIAL/PLATELET
Abs Immature Granulocytes: 0.03 10*3/uL (ref 0.00–0.07)
Abs Immature Granulocytes: 0.02 10*3/uL (ref 0.00–0.07)
BASOS ABS: 0 10*3/uL (ref 0.0–0.1)
Basophils Absolute: 0 10*3/uL (ref 0.0–0.1)
Basophils Relative: 0 %
Basophils Relative: 0 %
EOS ABS: 0 10*3/uL (ref 0.0–0.5)
EOS PCT: 0 %
Eosinophils Absolute: 0.2 10*3/uL (ref 0.0–0.5)
Eosinophils Relative: 2 %
HCT: 59.7 % — ABNORMAL HIGH (ref 39.0–52.0)
HEMATOCRIT: 62.2 % — AB (ref 39.0–52.0)
HEMOGLOBIN: 21.1 g/dL — AB (ref 13.0–17.0)
Hemoglobin: 20.1 g/dL — ABNORMAL HIGH (ref 13.0–17.0)
IMMATURE GRANULOCYTES: 0 %
Immature Granulocytes: 0 %
LYMPHS ABS: 1.2 10*3/uL (ref 0.7–4.0)
Lymphocytes Relative: 13 %
Lymphocytes Relative: 24 %
Lymphs Abs: 1.7 10*3/uL (ref 0.7–4.0)
MCH: 29.7 pg (ref 26.0–34.0)
MCH: 30.1 pg (ref 26.0–34.0)
MCHC: 33.9 g/dL (ref 30.0–36.0)
MCHC: 33.7 g/dL (ref 30.0–36.0)
MCV: 87.6 fL (ref 80.0–100.0)
MCV: 89.4 fL (ref 80.0–100.0)
MONOS PCT: 12 %
Monocytes Absolute: 1.1 10*3/uL — ABNORMAL HIGH (ref 0.1–1.0)
Monocytes Absolute: 1.1 10*3/uL — ABNORMAL HIGH (ref 0.1–1.0)
Monocytes Relative: 15 %
Neutro Abs: 6.8 10*3/uL (ref 1.7–7.7)
Neutro Abs: 4.4 10*3/uL (ref 1.7–7.7)
Neutrophils Relative %: 75 %
Neutrophils Relative %: 59 %
Platelets: 219 10*3/uL (ref 150–400)
Platelets: 194 10*3/uL (ref 150–400)
RBC: 7.1 MIL/uL — ABNORMAL HIGH (ref 4.22–5.81)
RBC: 6.68 MIL/uL — ABNORMAL HIGH (ref 4.22–5.81)
RDW: 14.3 % (ref 11.5–15.5)
RDW: 13.7 % (ref 11.5–15.5)
WBC: 9.2 10*3/uL (ref 4.0–10.5)
WBC: 7.4 10*3/uL (ref 4.0–10.5)
nRBC: 0 % (ref 0.0–0.2)
nRBC: 0 % (ref 0.0–0.2)

## 2018-08-02 LAB — URINALYSIS, ROUTINE W REFLEX MICROSCOPIC
Bilirubin Urine: NEGATIVE
GLUCOSE, UA: NEGATIVE mg/dL
Ketones, ur: NEGATIVE mg/dL
Leukocytes, UA: NEGATIVE
NITRITE: NEGATIVE
Protein, ur: >=300 mg/dL — AB
SPECIFIC GRAVITY, URINE: 1.008 (ref 1.005–1.030)
pH: 5 (ref 5.0–8.0)

## 2018-08-02 LAB — COMPREHENSIVE METABOLIC PANEL
ALBUMIN: 3.9 g/dL (ref 3.5–5.0)
ALT: 30 U/L (ref 0–44)
AST: 34 U/L (ref 15–41)
Alkaline Phosphatase: 103 U/L (ref 38–126)
Anion gap: 13 (ref 5–15)
BILIRUBIN TOTAL: 0.9 mg/dL (ref 0.3–1.2)
BUN: 18 mg/dL (ref 6–20)
CALCIUM: 9.2 mg/dL (ref 8.9–10.3)
CO2: 18 mmol/L — ABNORMAL LOW (ref 22–32)
Chloride: 104 mmol/L (ref 98–111)
Creatinine, Ser: 1.59 mg/dL — ABNORMAL HIGH (ref 0.61–1.24)
GFR calc Af Amer: >60 mL/min (ref >60)
GLUCOSE: 99 mg/dL (ref 70–99)
POTASSIUM: 3.7 mmol/L (ref 3.5–5.1)
Sodium: 135 mmol/L (ref 135–145)
TOTAL PROTEIN: 8 g/dL (ref 6.5–8.1)

## 2018-08-02 LAB — LIPASE, BLOOD: Lipase: 38 U/L (ref 11–51)

## 2018-08-02 MED ORDER — FAMOTIDINE 20 MG PO TABS
20.0000 mg | ORAL_TABLET | Freq: Once | ORAL | Status: AC
  Administered 2018-08-02: 20 mg via ORAL
  Filled 2018-08-02: qty 1

## 2018-08-02 MED ORDER — ONDANSETRON HCL 4 MG PO TABS: 4.0000 mg | ORAL_TABLET | Freq: Four times a day (QID) | ORAL | 0 refills | Status: DC

## 2018-08-02 MED ORDER — LIDOCAINE VISCOUS HCL 2 % MT SOLN
15.0000 mL | Freq: Once | OROMUCOSAL | Status: AC
  Administered 2018-08-02: 15 mL via ORAL
  Filled 2018-08-02: qty 15

## 2018-08-02 MED ORDER — SUCRALFATE 1 GM/10ML PO SUSP: 1.0000 g | Freq: Three times a day (TID) | ORAL | 0 refills | Status: DC

## 2018-08-02 MED ORDER — LOSARTAN POTASSIUM 50 MG PO TABS
50.0000 mg | ORAL_TABLET | Freq: Once | ORAL | Status: AC
  Administered 2018-08-02: 50 mg via ORAL
  Filled 2018-08-02: qty 1

## 2018-08-02 MED ORDER — SODIUM CHLORIDE 0.9 % IV BOLUS
1000.0000 mL | Freq: Once | INTRAVENOUS | Status: AC
  Administered 2018-08-02: 1000 mL via INTRAVENOUS

## 2018-08-02 MED ORDER — FAMOTIDINE 20 MG PO TABS: 20.0000 mg | ORAL_TABLET | Freq: Two times a day (BID) | ORAL | 0 refills | Status: DC

## 2018-08-02 MED ORDER — PROMETHAZINE HCL 25 MG/ML IJ SOLN
25.0000 mg | Freq: Once | INTRAMUSCULAR | Status: AC
  Administered 2018-08-02: 25 mg via INTRAVENOUS
  Filled 2018-08-02: qty 1

## 2018-08-02 MED ORDER — ALUM & MAG HYDROXIDE-SIMETH 200-200-20 MG/5ML PO SUSP
30.0000 mL | Freq: Once | ORAL | Status: AC
  Administered 2018-08-02: 30 mL via ORAL
  Filled 2018-08-02: qty 30

## 2018-08-02 NOTE — ED Provider Notes (Signed)
[facility]   CSN: [id number] Arrival date & time: 08/02/18  0146     History   Chief Complaint Chief Complaint  Patient presents with  . Abdominal Pain    HPI [name] is a 23 y.o. male with a history of atrial septal defect/PFO, asthma, and kidney disease who presents to the emergency department with a chief complaint of vomiting.  The patient endorses countless episodes of nonbloody, nonbilious vomiting over the last 2 days.  He reports that he has been able to keep down any food, but is able to keep down a minimal amount of water.  He reports associated upper abdominal pain, chills, shortness of breath, and nonproductive cough.  He states the pain radiates down into his lower abdomen.  Pain is 8 out of 10, constant, and characterized as squeezing.  No known aggravating or alleviating factors.  He denies diarrhea, dysuria, hematuria, back pain, rectal pain, melena, hematochezia, chest pain, headache, fever.  He reports that he was seen a few days ago in the ER with cough, sore throat, nasal congestion, and low back pain that began 2 weeks ago after he leaned forward to pick up an object at work.  He reports his back pain has improved with muscle relaxers and pain medication at home.  He denies alcohol use, IV or recreational drug use, smoking cigarettes, or marijuana use.  However, he does state that he is frequently around individuals that smoke tobacco and marijuana.  Of note, per chart review, patient previously tested positive for THC.  The history is provided by the patient. No language interpreter was used.    Past Medical History:  Diagnosis Date  . Asthma   . Renal disorder     There are no active problems to display for this patient.   History reviewed. No pertinent surgical history.      Home Medications    Prior to Admission medications   Medication Sig Start Date End Date Taking? Authorizing Provider    albuterol (PROVENTIL HFA;VENTOLIN HFA) 108 (90 Base) MCG/ACT inhaler Inhale 1-2 puffs into the lungs every 6 (six) hours as needed for wheezing or shortness of breath.   Yes [provider]  losartan (COZAAR) 50 MG tablet Take 1 tablet by mouth once a week.  12/23/14  Yes [provider]  methocarbamol (ROBAXIN) 500 MG tablet Take 1 tablet (500 mg total) by mouth at bedtime as needed for muscle spasms. 07/30/18  Yes Caccavale, Sophia, PA-C  Vitamin D, Ergocalciferol, (DRISDOL) 50000 units CAPS capsule Take 1 capsule by mouth once a week. 03/04/17  Yes [provider]  ondansetron (ZOFRAN ODT) 4 MG disintegrating tablet Take 1 tablet (4 mg total) by mouth every 8 (eight) hours as needed for nausea or vomiting. Patient not taking: Reported on 07/30/2018 08/11/17   Muthersbaugh, Dahlia ClientHannah, PA-C  ranitidine (ZANTAC) 150 MG capsule Take 1 capsule (150 mg total) by mouth daily. Patient not taking: Reported on 07/30/2018 08/11/17   Muthersbaugh, Boyd KerbsHannah, PA-C    Family History History reviewed. No pertinent family history.  Social History Social History   Tobacco Use  . Smoking status: Current Every Day Smoker  . Smokeless tobacco: Never Used  Substance Use Topics  . Alcohol use: No  . Drug use: No     Allergies   Amoxicillin   Review of Systems Review of Systems  Constitutional: Positive for chills. Negative for appetite change and fever.  Respiratory: Positive for cough and shortness  of breath.   Cardiovascular: Negative for chest pain, palpitations and leg swelling.  Gastrointestinal: Positive for abdominal pain, nausea and vomiting. Negative for anal bleeding, blood in stool, constipation, diarrhea and rectal pain.  Genitourinary: Negative for dysuria, frequency, penile pain, testicular pain and urgency.  Musculoskeletal: Negative for back pain.  Skin: Negative for rash and wound.  Allergic/Immunologic: Negative for immunocompromised state.  Neurological: Negative  for seizures, weakness, numbness and headaches.  Psychiatric/Behavioral: Negative for confusion.   Physical Exam Updated Vital Signs BP (!) 141/92 (BP Location: Left Arm)   Pulse (!) 104   Temp (!) 97.4 F (36.3 C) (Oral)   Resp 18   SpO2 94%   Physical Exam Vitals signs and nursing note reviewed.  Constitutional:      General: He is not in acute distress.    Appearance: He is well-developed. He is not ill-appearing, toxic-appearing or diaphoretic.  HENT:     Head: Normocephalic.  Eyes:     Conjunctiva/sclera: Conjunctivae normal.  Neck:     Musculoskeletal: Neck supple. No neck rigidity or muscular tenderness.     Vascular: No carotid bruit.  Cardiovascular:     Rate and Rhythm: Regular rhythm. Tachycardia present.     Pulses: Normal pulses.     Heart sounds: Normal heart sounds. No murmur. No friction rub. No gallop.   Pulmonary:     Effort: Pulmonary effort is normal. No respiratory distress.     Breath sounds: No stridor. No wheezing, rhonchi or rales.  Chest:     Chest wall: No tenderness.  Abdominal:     General: There is no distension.     Palpations: Abdomen is soft. There is no mass.     Tenderness: There is abdominal tenderness. There is left CVA tenderness. There is no right CVA tenderness, guarding or rebound.     Hernia: No hernia is present.     Comments: Tender to palpation in the epigastric, left upper quadrant, left lower quadrant, and right lower quadrant.  Abdomen is soft, nondistended.  No rebound or guarding.  No tenderness over McBurney's point.  Negative Murphy sign.  Tender to palpation over the left CVA; no right CVA tenderness.  Lymphadenopathy:     Cervical: No cervical adenopathy.  Skin:    General: Skin is warm and dry.  Neurological:     Mental Status: He is alert.  Psychiatric:        Behavior: Behavior normal.    ED Treatments / Results  Labs (all labs ordered are listed, but only abnormal results are displayed) Labs Reviewed  CBC  WITH DIFFERENTIAL/PLATELET - Abnormal; Notable for the following components:      Result Value   RBC 7.10 (*)    Hemoglobin 21.1 (*)    HCT 62.2 (*)    Monocytes Absolute 1.1 (*)    All other components within normal limits  COMPREHENSIVE METABOLIC PANEL - Abnormal; Notable for the following components:   CO2 18 (*)    Creatinine, Ser 1.59 (*)    All other components within normal limits  LIPASE, BLOOD  URINALYSIS, ROUTINE W REFLEX MICROSCOPIC    EKG None  Radiology No results found.  Procedures Procedures (including critical care time)  Medications Ordered in ED Medications  sodium chloride 0.9 % bolus 1,000 mL (has no administration in time range)  sodium chloride 0.9 % bolus 1,000 mL (0 mLs Intravenous Stopped 08/02/18 0707)  promethazine (PHENERGAN) injection 25 mg (25 mg Intravenous Given 08/02/18 0553)  Initial Impression / Assessment and Plan / ED Course  I have reviewed the triage vital signs and the nursing notes.  Pertinent labs & imaging results that were available during my care of the patient were reviewed by me and considered in my medical decision making (see chart for details).     23 year old male with a history of atrial septal defect/PFO, asthma, and kidney disease presenting with nausea, vomiting, abdominal pain, cough, shortness of breath, and chills.  He was seen in the ED on 07/30/2018 for URI symptoms.  Chest x-ray at that visit showed emphysema?  SaO2 95% on room air.  He is afebrile and mildly tachycardic in the 100s.  He denies smoking cigarettes, but chart review indicates that he has a current every day smoker and previous UDS is positive for THC.  On exam, he has tender in the epigastric, left abdomen, and right lower quadrant without rebound or guarding.  He also has left CVA tenderness.  He has no urinary symptoms.  CBC with RBC of 7.10 and H/H of 7.10/21.1 On 1/2, H/H is 19.2/58.  Per chart review, has been elevated in the past.  Although  CBC appears hemoconcentrated, there is concern for up trending H&H.  Question component of polycythemia vera.  The patient does have a small atrial septal defect/PFO, but also has a history of renal disease.  He had a renal biopsy in 2016, but results following biopsy are not available in care everywhere.  Metabolic panel is pending.  The patient was given IV fluid bolus and Phenergan.  Will sign the patient out to [facility] pending labs and reevaluation.  The patient may benefit from a second liter of fluids and repeat CBC. Patient presentation, ED course, and plan of care discussed with review of all pertinent labs and imaging. Please see his/her note for further details regarding further ED course and disposition.   Final Clinical Impressions(s) / ED Diagnoses   Final diagnoses:  None    ED Discharge Orders    None       McDonald, Mia A, PA-C 08/02/18 8502    Derwood Kaplan, MD 08/03/18 [phone]

## 2018-08-02 NOTE — Discharge Instructions (Signed)
Take Pepcid twice daily.  Take Zofran every 6 hours as needed for nausea or vomiting.  Take Carafate prior to eating and before bed.  Begin with a clear liquid diet and progress to bland food as tolerated.  Stick with bland foods including bananas, rice, applesauce, toast and progress.  Avoid any spicy, fatty, greasy foods.  Please return the emergency department develop any new or worsening symptoms or localizing abdominal pain that is staying in one place such as the right lower quadrant.  Please follow-up with a hematologist for further evaluation and treatment of your elevated hemoglobin, which has been elevated in the past several blood draws we have for you.

## 2018-08-02 NOTE — ED Notes (Signed)
Patient given discharge teaching and verbalized understanding. Patient ambulated out of ED with a steady gait. 

## 2018-08-02 NOTE — ED Provider Notes (Signed)
Signout from [facility], PA-C at shift change See previous provider's note for full H&P  Briefly, patient presents with emesis x 2 days. Can't keep anything down. He is reporting AP pain (LUQ and RLQ).  Denies fever or diarrhea.  Patient was seen for cough, SOB, and back pain a few days ago which is resolving.  ? Cannabis hyperemesis, although patient currently denies marijuana use he states he is "been around it." Plan: if labs stable and pain/emesis controlled, no imaging.  Hemoglobin has returned elevated, 21.1.  Renal function is stable.  Plan to hydrate and reassess considering patient's hemoglobin has been elevated in the past.  This may be related to hemoconcentration, however if persistently elevated, may need referral to hematology.  9:29 AM on reevaluation, patient has had no more emesis and is tolerating oral fluids.  He is still having some epigastric pain, but epigastric tenderness only on exam.  Will order Pepcid and Maalox with viscous lidocaine.  Will repeat CBC to check for hemoglobin level following 2 L of fluid complete.  Patient's repeat hemoglobin 20.1.  This is mildly decreased, however considering persistent elevation, will refer to hematology for further work-up of this.  Patient will be discharged home with Pepcid, Zofran, Carafate.  He is advised progressive diet increase.  On repeat exam, he only reports a burning sensation in his epigastrium and denies any other abdominal pain.  Abdomen is otherwise nontender.  Patient discharged in satisfactory condition.  He is advised to resume taking his blood pressure medication, but given a dose of his home losartan before discharge.   Travis Holes, PA-C 08/02/18 1811    Derwood Kaplan, MD 08/03/18 [phone]

## 2018-08-02 NOTE — ED Notes (Signed)
Patient stated that nausea has improved since last assessed. Pain was assessed at 06/10. Patient appears comfortable with no other complaints at this time. This RN will continue to monitor.

## 2018-08-02 NOTE — ED Triage Notes (Signed)
Pt was seen and treated for back pain two days ago, he states that yesterday he started vomiting Pt was walking around in the lobby but then acted as if he couldn't walk when called to triage

## 2019-06-20 ENCOUNTER — Other Ambulatory Visit: Payer: Self-pay

## 2019-06-20 ENCOUNTER — Emergency Department (HOSPITAL_COMMUNITY)
Admission: EM | Admit: 2019-06-20 | Discharge: 2019-06-20 | Disposition: A | Discharge status: Home / Self Care | Payer: Medicaid Other | Attending: Emergency Medicine | Admitting: Emergency Medicine

## 2019-06-20 DIAGNOSIS — R112 Nausea with vomiting, unspecified: Secondary | ICD-10-CM | POA: Insufficient documentation

## 2019-06-20 DIAGNOSIS — F17210 Nicotine dependence, cigarettes, uncomplicated: Secondary | ICD-10-CM | POA: Diagnosis not present

## 2019-06-20 DIAGNOSIS — R10817 Generalized abdominal tenderness: Secondary | ICD-10-CM | POA: Insufficient documentation

## 2019-06-20 DIAGNOSIS — R1010 Upper abdominal pain, unspecified: Secondary | ICD-10-CM | POA: Diagnosis present

## 2019-06-20 DIAGNOSIS — Z79899 Other long term (current) drug therapy: Secondary | ICD-10-CM | POA: Insufficient documentation

## 2019-06-20 DIAGNOSIS — F1290 Cannabis use, unspecified, uncomplicated: Secondary | ICD-10-CM | POA: Insufficient documentation

## 2019-06-20 DIAGNOSIS — I10 Essential (primary) hypertension: Secondary | ICD-10-CM | POA: Diagnosis not present

## 2019-06-20 LAB — RAPID URINE DRUG SCREEN, HOSP PERFORMED
Amphetamines: NOT DETECTED
Barbiturates: NOT DETECTED
Benzodiazepines: NOT DETECTED
Cocaine: NOT DETECTED
Opiates: NOT DETECTED
Tetrahydrocannabinol: POSITIVE — AB

## 2019-06-20 LAB — URINALYSIS, ROUTINE W REFLEX MICROSCOPIC
Bilirubin Urine: NEGATIVE
Glucose, UA: NEGATIVE mg/dL
Hgb urine dipstick: NEGATIVE
Ketones, ur: NEGATIVE mg/dL
Leukocytes,Ua: NEGATIVE
Nitrite: NEGATIVE
Protein, ur: 100 mg/dL — AB
Specific Gravity, Urine: 1.017 (ref 1.005–1.030)
pH: 6 (ref 5.0–8.0)

## 2019-06-20 LAB — COMPREHENSIVE METABOLIC PANEL
ALT: 31 U/L (ref 0–44)
AST: 37 U/L (ref 15–41)
Albumin: 3.4 g/dL — ABNORMAL LOW (ref 3.5–5.0)
Alkaline Phosphatase: 85 U/L (ref 38–126)
Anion gap: 9 (ref 5–15)
BUN: 23 mg/dL — ABNORMAL HIGH (ref 6–20)
CO2: 22 mmol/L (ref 22–32)
Calcium: 8.8 mg/dL — ABNORMAL LOW (ref 8.9–10.3)
Chloride: 108 mmol/L (ref 98–111)
Creatinine, Ser: 1.91 mg/dL — ABNORMAL HIGH (ref 0.61–1.24)
GFR calc Af Amer: 56 mL/min — ABNORMAL LOW (ref >60)
GFR calc non Af Amer: 48 mL/min — ABNORMAL LOW (ref >60)
Glucose, Bld: 85 mg/dL (ref 70–99)
Potassium: 4 mmol/L (ref 3.5–5.1)
Sodium: 139 mmol/L (ref 135–145)
Total Bilirubin: 0.7 mg/dL (ref 0.3–1.2)
Total Protein: 6.4 g/dL — ABNORMAL LOW (ref 6.5–8.1)

## 2019-06-20 LAB — CBC
HCT: 54.8 % — ABNORMAL HIGH (ref 39.0–52.0)
Hemoglobin: 18.9 g/dL — ABNORMAL HIGH (ref 13.0–17.0)
MCH: 30.7 pg (ref 26.0–34.0)
MCHC: 34.5 g/dL (ref 30.0–36.0)
MCV: 89 fL (ref 80.0–100.0)
Platelets: 261 10*3/uL (ref 150–400)
RBC: 6.16 MIL/uL — ABNORMAL HIGH (ref 4.22–5.81)
RDW: 13.1 % (ref 11.5–15.5)
WBC: 6.9 10*3/uL (ref 4.0–10.5)
nRBC: 0 % (ref 0.0–0.2)

## 2019-06-20 LAB — LIPASE, BLOOD: Lipase: 43 U/L (ref 11–51)

## 2019-06-20 MED ORDER — PROMETHAZINE HCL 25 MG/ML IJ SOLN
12.5000 mg | Freq: Once | INTRAMUSCULAR | Status: AC
  Administered 2019-06-20: 12.5 mg via INTRAVENOUS
  Filled 2019-06-20: qty 1

## 2019-06-20 MED ORDER — LIDOCAINE VISCOUS HCL 2 % MT SOLN
15.0000 mL | Freq: Once | OROMUCOSAL | Status: AC
  Administered 2019-06-20: 11:00:00 15 mL via ORAL
  Filled 2019-06-20: qty 15

## 2019-06-20 MED ORDER — PROCHLORPERAZINE EDISYLATE 10 MG/2ML IJ SOLN
10.0000 mg | Freq: Once | INTRAMUSCULAR | Status: DC
  Filled 2019-06-20: qty 2

## 2019-06-20 MED ORDER — SODIUM CHLORIDE 0.9 % IV BOLUS
1000.0000 mL | Freq: Once | INTRAVENOUS | Status: AC
  Administered 2019-06-20: 11:00:00 1000 mL via INTRAVENOUS

## 2019-06-20 MED ORDER — ALUM & MAG HYDROXIDE-SIMETH 200-200-20 MG/5ML PO SUSP
30.0000 mL | Freq: Once | ORAL | Status: AC
  Administered 2019-06-20: 11:00:00 30 mL via ORAL
  Filled 2019-06-20: qty 30

## 2019-06-20 MED ORDER — KETOROLAC TROMETHAMINE 15 MG/ML IJ SOLN
15.0000 mg | Freq: Once | INTRAMUSCULAR | Status: AC
  Administered 2019-06-20: 12:00:00 15 mg via INTRAMUSCULAR
  Filled 2019-06-20: qty 1

## 2019-06-20 NOTE — ED Provider Notes (Signed)
[facility] EMERGENCY DEPARTMENT Provider Note   CSN: [id number] Arrival date & time: 06/20/19  3295     History Chief Complaint  Patient presents with  . Abdominal Pain    [name] is a 23 y.o. male.  23 y.o male with a PMH of Asthma, Renal Disorder presents to the ED with a chief complaint of abdominal pain x two days. Patient describes this as knots turning on the upper abdomen for the past 2 days.  As his pain has been constant, he has not taken any medication for improvement in symptoms.  He did have 3 episodes of nonbilious, nonbloody emesis in the past 2 days.  He does report his last meal was prior to arrival consisting of a [employer] burger and fries.  He does have previous visits in the past to the emergency department for similar complaints.  He is currently noncompliant with his hypertension medication.  Denies any alcohol or illicit drug use.  He denies any fever, past surgical history to his abdomen, chest pain, shortness of breath, sick exposures.  The history is provided by the patient and medical records.       Past Medical History:  Diagnosis Date  . Asthma   . Renal disorder     There are no problems to display for this patient.   No past surgical history on file.     No family history on file.  Social History   Tobacco Use  . Smoking status: Current Every Day Smoker  . Smokeless tobacco: Never Used  Substance Use Topics  . Alcohol use: No  . Drug use: No    Home Medications Prior to Admission medications   Medication Sig Start Date End Date Taking? Authorizing Provider  albuterol (PROVENTIL HFA;VENTOLIN HFA) 108 (90 Base) MCG/ACT inhaler Inhale 1-2 puffs into the lungs every 6 (six) hours as needed for wheezing or shortness of breath.    [provider]  famotidine (PEPCID) 20 MG tablet Take 1 tablet (20 mg total) by mouth 2 (two) times daily. 08/02/18   Law, Bea Graff, PA-C  losartan (COZAAR) 50 MG tablet  Take 1 tablet by mouth once a week.  12/23/14   [provider]  methocarbamol (ROBAXIN) 500 MG tablet Take 1 tablet (500 mg total) by mouth at bedtime as needed for muscle spasms. 07/30/18   Caccavale, Sophia, PA-C  ondansetron (ZOFRAN ODT) 4 MG disintegrating tablet Take 1 tablet (4 mg total) by mouth every 8 (eight) hours as needed for nausea or vomiting. Patient not taking: Reported on 07/30/2018 08/11/17   Muthersbaugh, Jarrett Soho, PA-C  ondansetron (ZOFRAN) 4 MG tablet Take 1 tablet (4 mg total) by mouth every 6 (six) hours. 08/02/18   Law, Bea Graff, PA-C  ranitidine (ZANTAC) 150 MG capsule Take 1 capsule (150 mg total) by mouth daily. Patient not taking: Reported on 07/30/2018 08/11/17   Muthersbaugh, Jarrett Soho, PA-C  sucralfate (CARAFATE) 1 GM/10ML suspension Take 10 mLs (1 g total) by mouth 4 (four) times daily -  with meals and at bedtime. 08/02/18   Frederica Kuster, PA-C  Vitamin D, Ergocalciferol, (DRISDOL) 50000 units CAPS capsule Take 1 capsule by mouth once a week. 03/04/17   [provider]    Allergies    Amoxicillin  Review of Systems   Review of Systems  Constitutional: Negative for fever.  HENT: Negative for sore throat.   Respiratory: Negative for shortness of breath.   Gastrointestinal: Positive for abdominal pain, nausea and vomiting.  Negative for blood in stool, constipation and diarrhea.  Genitourinary: Negative for flank pain.  Musculoskeletal: Negative for back pain.  Skin: Negative for pallor and wound.  Neurological: Negative for light-headedness and headaches.    Physical Exam Updated Vital Signs BP (!) 151/116 (BP Location: Left Arm)   Pulse 80   Temp 98.4 F (36.9 C) (Oral)   Resp 16   SpO2 99%   Physical Exam Vitals and nursing note reviewed.  Constitutional:      Appearance: He is well-developed. He is not ill-appearing.     Comments: Non-ill-appearing.  HENT:     Head: Normocephalic and atraumatic.  Eyes:     General: No scleral  icterus.    Pupils: Pupils are equal, round, and reactive to light.  Cardiovascular:     Heart sounds: Normal heart sounds.  Pulmonary:     Effort: Pulmonary effort is normal.     Breath sounds: Normal breath sounds. No wheezing.  Chest:     Chest wall: No tenderness.  Abdominal:     General: Bowel sounds are normal. There is no distension.     Palpations: Abdomen is soft.     Tenderness: There is generalized abdominal tenderness. There is no right CVA tenderness, left CVA tenderness or guarding.     Comments: Abdomen soft, tender to palpation throughout.  No focal point of tenderness.  Musculoskeletal:        General: No tenderness or deformity.     Cervical back: Normal range of motion.  Skin:    General: Skin is warm and dry.  Neurological:     Mental Status: He is alert and oriented to person, place, and time.     ED Results / Procedures / Treatments   Labs (all labs ordered are listed, but only abnormal results are displayed) Labs Reviewed  COMPREHENSIVE METABOLIC PANEL - Abnormal; Notable for the following components:      Result Value   BUN 23 (*)    Creatinine, Ser 1.91 (*)    Calcium 8.8 (*)    Total Protein 6.4 (*)    Albumin 3.4 (*)    GFR calc non Af Amer 48 (*)    GFR calc Af Amer 56 (*)    All other components within normal limits  CBC - Abnormal; Notable for the following components:   RBC 6.16 (*)    Hemoglobin 18.9 (*)    HCT 54.8 (*)    All other components within normal limits  LIPASE, BLOOD  URINALYSIS, ROUTINE W REFLEX MICROSCOPIC  RAPID URINE DRUG SCREEN, HOSP PERFORMED    EKG None  Radiology No results found.  Procedures Procedures (including critical care time)  Medications Ordered in ED Medications  sodium chloride 0.9 % bolus 1,000 mL (has no administration in time range)  alum & mag hydroxide-simeth (MAALOX/MYLANTA) 200-200-20 MG/5ML suspension 30 mL (has no administration in time range)    And  lidocaine (XYLOCAINE) 2 % viscous  mouth solution 15 mL (has no administration in time range)  promethazine (PHENERGAN) injection 12.5 mg (has no administration in time range)    ED Course  I have reviewed the triage vital signs and the nursing notes.  Pertinent labs & imaging results that were available during my care of the patient were reviewed by me and considered in my medical decision making (see chart for details).    MDM Rules/Calculators/A&P     Patient with a past medical history of hypertension, noncompliant with medication, asthma presents to  the ED with complaints of upper abdominal pain for the past 2 days.  Last meal was prior to arrival consisting of [employer] meal.  He has had 2 episodes of vomiting since yesterday and today.  No blood or bilious reported.  He arrived in the ED non-ill-appearing, afebrile, abdomen is soft however tender to palpation throughout.  Bowel sounds are normal.  I have extensively reviewed patient's chart, he does have tubal prior visits for nausea, vomiting, UDS have been positive for THC.  He denies any alcohol or illicit drug use.  He is nontoxic-appearing, will provide him with medication for his nausea along with fluids and further evaluation.  CMP with a slight elevation in creatinine, although this is consistent with patient's previous visits.  FTs are within normal limits.  CBC without any leukocytosis, he is slightly hemoconcentrated, this has been the case on previous visits.  UA with some protein, no bacteria or source of infection.  UDS is positive for THC.  Patient was reevaluated by me, he is nontoxic-appearing, he is requesting CT of his abdomen, discussed that his UDS was positive for [facility], he states "so should stop being around people that smoke marijuana ".  Continues to deny marijuana use.  He was given a GI cocktail, Phenergan, bolus to help with his symptoms.  Reports his pain has improved.  Discussed risks and benefits of radiation.  He does have an ultrasound on file  from 2 years ago which was within normal limits.  His abdomen is benign, he is nontoxic-appearing with stable vital signs.  Will provide him with more pain medication and reassessment..  12:22 PM reports mild improvement in pain, he is tolerating p.o.'s, finishing drill along with crackers.  Girlfriend at the bedside is requesting instructions, he is advised to obtain primary care follow-up in order to keep his pressure under control as his kidney function has consistently elevated for the past several years.  Her friend does report he does smoke marijuana although he denies this at this time.  he remains nontoxic appearing, abdomen is soft, no peritoneal signs on my exam.  SPECT the patient's symptoms are likely being caused from [facility].  Patient provided with outpatient follow-up, return precautions discussed at length.    Portions of this note were generated with Scientist, clinical (histocompatibility and immunogenetics). Dictation errors may occur despite best attempts at proofreading.  Final Clinical Impression(s) / ED Diagnoses Final diagnoses:  Pain of upper abdomen    Rx / DC Orders ED Discharge Orders    None       Claude Manges, PA-C 06/20/19 1230    Milagros Loll, MD 06/21/19 1644

## 2019-06-20 NOTE — ED Triage Notes (Signed)
Pt endorses abd pain with 2 episodes of vomiting since yesterday. Denies fever or chills. Hypertensive, has hx of same but not taking meds as prescribed.

## 2019-06-20 NOTE — ED Notes (Signed)
Girlfriend Mikayla to be updated when pt in room. Phone number [phone].

## 2019-06-20 NOTE — Discharge Instructions (Addendum)
Your laboratory results were within normal limits today, your kidney function was elevated but similar to your previous visits.   We will need to establish care with a primary care physician in order to manage your blood pressure, the number to the [facility] health and wellness clinic is attached to your paperwork.  Please refrain from use and marijuana, as this can be contributing to your abdominal pain.  You experience any further vomiting, nausea, worsening symptoms you may return to the emergency department.

## 2019-07-10 ENCOUNTER — Telehealth: Payer: Self-pay | Admitting: Hematology and Oncology

## 2019-07-10 NOTE — Telephone Encounter (Signed)
Received a new hem referral from [facility] for high HGB of 18.3 with normal iron panel. [name] has been cld and scheduled to see Dr. Lorenso Courier on 1/14 at 9am. Pt aware to arrive 15 minutes early.

## 2019-07-23 NOTE — Progress Notes (Deleted)
[place] Telephone:(336) [phone]   Fax:(336) [place] NOTE  Patient Care Team: System, Pcp Not In as PCP - General  Hematological/Oncological History # Polycythemia 1) 08/02/2018: WBC 7.4, Hgb 20.1, Plt 194, MCV 89.4 2) 06/20/2019: WBC 6.9, Hgb 18.9, MCV 89, Plt 261 3) 07/08/2019: Hgb 18.3 (no further CBC elements drawn at that time)  CHIEF COMPLAINTS/PURPOSE OF CONSULTATION:  Polycythemia  HISTORY OF PRESENTING ILLNESS:  [name] 24 y.o. 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 He is seen at the request of [facility].   On review of prior records [name] has polycythemia dating back to at least 08/02/2018 at that time he was noted to have hemoglobin of 10.1 with normal white blood cell count platelets and MCV.  Most recently he had a CBC drawn on 06/20/2019 which showed a white blood cell count of 6.9 hemoglobin 18.9 MCV of 89 and platelets of 261.  The patient was subsequently seen by nephrology on 07/08/2019 at which time part of his nephrology panel showed a hemoglobin 18.3.  Due to concern for polycythemia the patient was referred to hematology for further evaluation and management.  On exam today ***  MEDICAL HISTORY:  Past Medical History:  Diagnosis Date  . Asthma   . Renal disorder     SURGICAL HISTORY: No past surgical history on file.  SOCIAL HISTORY: Social History   Socioeconomic History  . Marital status: Single    Spouse name: Not on file  . Number of children: Not on file  . Years of education: Not on file  . Highest education level: Not on file  Occupational History  . Not on file  Tobacco Use  . Smoking status: Current Every Day Smoker  . Smokeless tobacco: Never Used  Substance and Sexual Activity  . Alcohol use: No  . Drug use: No  . Sexual activity: Not on file  Other Topics Concern  . Not on file  Social  History Narrative  . Not on file   Social Determinants of Health   Financial Resource Strain:   . Difficulty of Paying Living Expenses: Not on file  Food Insecurity:   . Worried About Charity fundraiser in the Last Year: Not on file  . Ran Out of Food in the Last Year: Not on file  Transportation Needs:   . Lack of Transportation (Medical): Not on file  . Lack of Transportation (Non-Medical): Not on file  Physical Activity:   . Days of Exercise per Week: Not on file  . Minutes of Exercise per Session: Not on file  Stress:   . Feeling of Stress : Not on file  Social Connections:   . Frequency of Communication with Friends and Family: Not on file  . Frequency of Social Gatherings with Friends and Family: Not on file  . Attends Religious Services: Not on file  . Active Member of Clubs or Organizations: Not on file  . Attends Archivist Meetings: Not on file  . Marital Status: Not on file  Intimate Partner Violence:   . Fear of Current or Ex-Partner: Not on file  . Emotionally Abused: Not on file  . Physically Abused: Not on file  . Sexually Abused: Not on file    FAMILY HISTORY: No family history on file.  ALLERGIES:  is allergic to amoxicillin.  MEDICATIONS:  Current Outpatient Medications  Medication Sig Dispense Refill  .  albuterol (PROVENTIL HFA;VENTOLIN HFA) 108 (90 Base) MCG/ACT inhaler Inhale 1-2 puffs into the lungs every 6 (six) hours as needed for wheezing or shortness of breath.    . famotidine (PEPCID) 20 MG tablet Take 1 tablet (20 mg total) by mouth 2 (two) times daily. 30 tablet 0  . losartan (COZAAR) 50 MG tablet Take 1 tablet by mouth once a week.     . methocarbamol (ROBAXIN) 500 MG tablet Take 1 tablet (500 mg total) by mouth at bedtime as needed for muscle spasms. 7 tablet 0  . ondansetron (ZOFRAN ODT) 4 MG disintegrating tablet Take 1 tablet (4 mg total) by mouth every 8 (eight) hours as needed for nausea or vomiting. (Patient not taking:  Reported on 07/30/2018) 6 tablet 0  . ondansetron (ZOFRAN) 4 MG tablet Take 1 tablet (4 mg total) by mouth every 6 (six) hours. 12 tablet 0  . ranitidine (ZANTAC) 150 MG capsule Take 1 capsule (150 mg total) by mouth daily. (Patient not taking: Reported on 07/30/2018) 14 capsule 0  . sucralfate (CARAFATE) 1 GM/10ML suspension Take 10 mLs (1 g total) by mouth 4 (four) times daily -  with meals and at bedtime. 420 mL 0  . Vitamin D, Ergocalciferol, (DRISDOL) 50000 units CAPS capsule Take 1 capsule by mouth once a week.  3   No current facility-administered medications for this visit.    REVIEW OF SYSTEMS:   Constitutional: ( - ) fevers, ( - )  chills , ( - ) night sweats Eyes: ( - ) blurriness of vision, ( - ) double vision, ( - ) watery eyes Ears, nose, mouth, throat, and face: ( - ) mucositis, ( - ) sore throat Respiratory: ( - ) cough, ( - ) dyspnea, ( - ) wheezes Cardiovascular: ( - ) palpitation, ( - ) chest discomfort, ( - ) lower extremity swelling Gastrointestinal:  ( - ) nausea, ( - ) heartburn, ( - ) change in bowel habits Skin: ( - ) abnormal skin rashes Lymphatics: ( - ) new lymphadenopathy, ( - ) easy bruising Neurological: ( - ) numbness, ( - ) tingling, ( - ) new weaknesses Behavioral/Psych: ( - ) mood change, ( - ) new changes  All other systems were reviewed with the patient and are negative.  PHYSICAL EXAMINATION: ECOG PERFORMANCE STATUS: {CHL ONC ECOG PS:[id number]}  There were no vitals filed for this visit. There were no vitals filed for this visit.  GENERAL: well appearing *** in NAD  SKIN: skin color, texture, turgor are normal, no rashes or significant lesions EYES: conjunctiva are pink and non-injected, sclera clear OROPHARYNX: no exudate, no erythema; lips, buccal mucosa, and tongue normal  NECK: supple, non-tender LYMPH:  no palpable lymphadenopathy in the cervical, axillary or inguinal LUNGS: clear to auscultation and percussion with normal breathing  effort HEART: regular rate & rhythm and no murmurs and no lower extremity edema ABDOMEN: soft, non-tender, non-distended, normal bowel sounds Musculoskeletal: no cyanosis of digits and no clubbing  PSYCH: alert & oriented x 3, fluent speech NEURO: no focal motor/sensory deficits  LABORATORY DATA:  I have reviewed the data as listed Lab Results  Component Value Date   WBC 6.9 06/20/2019   HGB 18.9 (H) 06/20/2019   HCT 54.8 (H) 06/20/2019   MCV 89.0 06/20/2019   PLT 261 06/20/2019   NEUTROABS 4.4 08/02/2018    PATHOLOGY: ***  BLOOD FILM: *** Review of the peripheral blood smear showed normal appearing white cells with neutrophils that were  appropriately lobated and granulated. There was no predominance of bi-lobed or hyper-segmented neutrophils appreciated. No Dohle bodies were noted. There was no left shifting, immature forms or blasts noted. Lymphocytes remain normal in size without any predominance of large granular lymphocytes. Red cells show no anisopoikilocytosis, macrocytes , microcytes or polychromasia. There were no schistocytes, target cells, echinocytes, acanthocytes, dacrocytes, or stomatocytes.There was no rouleaux formation, nucleated red cells, or intra-cellular inclusions noted. The platelets are normal in size, shape, and color without any clumping evident.  RADIOGRAPHIC STUDIES: I have personally reviewed the radiological images as listed and agreed with the findings in the report. No results found.  ASSESSMENT & PLAN [name] 24 y.o. 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 He is seen at the request of [facility].   #Polycythemia --today will check CBC, CMP, peripheral blood film, and erythropoietin level --given that the patient has CKD and should have poor erythropoietin secretion at baseline, I will move forward with testing for MPNs today. Will send for JAK2, MPL,  and CALR w/ reflex and BCR/ABL.  --if erythropoietin is elevated consider a sleep study for symptoms for obstructive sleep apnea --RTC in 3 months or sooner if indicated by above labs.   No orders of the defined types were placed in this encounter.   All questions were answered. The patient knows to call the clinic with any problems, questions or concerns.  A total of more than {CHL ONC TIME VISIT - DUKGU:[id number]} were spent on this encounter and over half of that time was spent on counseling and coordination of care as outlined above.   Travis Barns, MD Department of Hematology/Oncology [facility] Phone: [phone] Pager: [phone] Email: [email or web address]  07/23/2019 11:52 AM

## 2019-07-25 ENCOUNTER — Inpatient Hospital Stay: Payer: Medicaid Other | Attending: Hematology and Oncology | Admitting: Hematology and Oncology

## 2019-07-25 ENCOUNTER — Inpatient Hospital Stay: Payer: Medicaid Other

## 2019-08-19 ENCOUNTER — Other Ambulatory Visit: Payer: Self-pay

## 2019-08-19 ENCOUNTER — Emergency Department (HOSPITAL_COMMUNITY): Payer: Medicaid Other

## 2019-08-19 ENCOUNTER — Emergency Department (HOSPITAL_COMMUNITY)
Admission: EM | Admit: 2019-08-19 | Discharge: 2019-08-19 | Disposition: A | Discharge status: Home / Self Care | Payer: Medicaid Other | Attending: Emergency Medicine | Admitting: Emergency Medicine

## 2019-08-19 DIAGNOSIS — R112 Nausea with vomiting, unspecified: Secondary | ICD-10-CM | POA: Diagnosis not present

## 2019-08-19 DIAGNOSIS — F323 Major depressive disorder, single episode, severe with psychotic features: Secondary | ICD-10-CM | POA: Insufficient documentation

## 2019-08-19 DIAGNOSIS — J45909 Unspecified asthma, uncomplicated: Secondary | ICD-10-CM | POA: Insufficient documentation

## 2019-08-19 DIAGNOSIS — R1011 Right upper quadrant pain: Secondary | ICD-10-CM | POA: Diagnosis present

## 2019-08-19 DIAGNOSIS — F17200 Nicotine dependence, unspecified, uncomplicated: Secondary | ICD-10-CM | POA: Insufficient documentation

## 2019-08-19 DIAGNOSIS — R45851 Suicidal ideations: Secondary | ICD-10-CM | POA: Insufficient documentation

## 2019-08-19 LAB — CBC
HCT: 56.4 % — ABNORMAL HIGH (ref 39.0–52.0)
Hemoglobin: 19 g/dL — ABNORMAL HIGH (ref 13.0–17.0)
MCH: 30.2 pg (ref 26.0–34.0)
MCHC: 33.7 g/dL (ref 30.0–36.0)
MCV: 89.7 fL (ref 80.0–100.0)
Platelets: 226 10*3/uL (ref 150–400)
RBC: 6.29 MIL/uL — ABNORMAL HIGH (ref 4.22–5.81)
RDW: 13 % (ref 11.5–15.5)
WBC: 5.2 10*3/uL (ref 4.0–10.5)
nRBC: 0 % (ref 0.0–0.2)

## 2019-08-19 LAB — URINALYSIS, ROUTINE W REFLEX MICROSCOPIC
Bacteria, UA: NONE SEEN
Bilirubin Urine: NEGATIVE
Glucose, UA: NEGATIVE mg/dL
Hgb urine dipstick: NEGATIVE
Ketones, ur: NEGATIVE mg/dL
Leukocytes,Ua: NEGATIVE
Nitrite: NEGATIVE
Protein, ur: 100 mg/dL — AB
Specific Gravity, Urine: 1.014 (ref 1.005–1.030)
pH: 5 (ref 5.0–8.0)

## 2019-08-19 LAB — COMPREHENSIVE METABOLIC PANEL
ALT: 58 U/L — ABNORMAL HIGH (ref 0–44)
AST: 43 U/L — ABNORMAL HIGH (ref 15–41)
Albumin: 3.8 g/dL (ref 3.5–5.0)
Alkaline Phosphatase: 88 U/L (ref 38–126)
Anion gap: 9 (ref 5–15)
BUN: 14 mg/dL (ref 6–20)
CO2: 21 mmol/L — ABNORMAL LOW (ref 22–32)
Calcium: 9.4 mg/dL (ref 8.9–10.3)
Chloride: 109 mmol/L (ref 98–111)
Creatinine, Ser: 1.77 mg/dL — ABNORMAL HIGH (ref 0.61–1.24)
GFR calc Af Amer: >60 mL/min (ref >60)
GFR calc non Af Amer: 53 mL/min — ABNORMAL LOW (ref >60)
Glucose, Bld: 89 mg/dL (ref 70–99)
Potassium: 4.3 mmol/L (ref 3.5–5.1)
Sodium: 139 mmol/L (ref 135–145)
Total Bilirubin: 0.8 mg/dL (ref 0.3–1.2)
Total Protein: 6.9 g/dL (ref 6.5–8.1)

## 2019-08-19 LAB — LIPASE, BLOOD: Lipase: 49 U/L (ref 11–51)

## 2019-08-19 MED ORDER — SODIUM CHLORIDE 0.9 % IV BOLUS
1000.0000 mL | Freq: Once | INTRAVENOUS | Status: AC
  Administered 2019-08-19: 15:00:00 1000 mL via INTRAVENOUS

## 2019-08-19 MED ORDER — IOHEXOL 300 MG/ML  SOLN
100.0000 mL | Freq: Once | INTRAMUSCULAR | Status: AC | PRN
  Administered 2019-08-19: 100 mL via INTRAVENOUS

## 2019-08-19 MED ORDER — PROMETHAZINE HCL 25 MG/ML IJ SOLN
12.5000 mg | Freq: Once | INTRAMUSCULAR | Status: AC
  Administered 2019-08-19: 12.5 mg via INTRAVENOUS
  Filled 2019-08-19: qty 1

## 2019-08-19 MED ORDER — SODIUM CHLORIDE 0.9% FLUSH
3.0000 mL | Freq: Once | INTRAVENOUS | Status: AC
  Administered 2019-08-19: 15:00:00 3 mL via INTRAVENOUS

## 2019-08-19 MED ORDER — ONDANSETRON HCL 4 MG/2ML IJ SOLN
4.0000 mg | Freq: Once | INTRAMUSCULAR | Status: AC
  Administered 2019-08-19: 15:00:00 4 mg via INTRAVENOUS
  Filled 2019-08-19: qty 2

## 2019-08-19 NOTE — ED Provider Notes (Signed)
[facility] Provider Note   CSN: [id number] Arrival date & time: 08/19/19  1224     History Chief Complaint  Patient presents with   Emesis   Abdominal Pain    [name] is a 24 y.o. male.  24 y.o male with a PMH of Asthma, Renal disorder presents to the ED with a chief complaint of abdominal pain and suicidal ideations x yesterday.  Patient describes his pain along the upper aspect of his abdomen, reports this is on and off, he has had 3 episodes of emesis since yesterday.  Patient reports he has been under a lot of stress, as his father recently died in 2022/08/19, he also experienced another death of his little brother in the family.  He also reports he has had some suicidal ideations, does not have a plan but feels very depressed during this time.  He has not taken any medication for improvement in his abdominal pain.  Mild anorexia, last meal was sometime yesterday.  No prior surgical history to abdomen.  Denies any fevers, HI, hallucinations.  No alcohol use, no illicit drug use. No urinary symptoms.   The history is provided by the patient and medical records.       Past Medical History:  Diagnosis Date   Asthma    Renal disorder     There are no problems to display for this patient.   No past surgical history on file.     No family history on file.  Social History   Tobacco Use   Smoking status: Current Every Day Smoker   Smokeless tobacco: Never Used  Substance Use Topics   Alcohol use: No   Drug use: No    Home Medications Prior to Admission medications   Medication Sig Start Date End Date Taking? Authorizing Provider  losartan (COZAAR) 50 MG tablet Take 1 tablet by mouth daily.  12/23/14  Yes [provider]  Vitamin D, Ergocalciferol, (DRISDOL) 50000 units CAPS capsule Take 1 capsule by mouth once a week. 03/04/17  Yes [provider]    Allergies    Amoxicillin  Review of Systems     Review of Systems  Constitutional: Negative for fever.  HENT: Negative for rhinorrhea and sore throat.   Respiratory: Negative for shortness of breath.   Cardiovascular: Negative for chest pain.  Gastrointestinal: Positive for abdominal pain, nausea and vomiting. Negative for diarrhea.  Genitourinary: Negative for flank pain.  Musculoskeletal: Negative for back pain.  Skin: Negative for pallor.  Neurological: Negative for light-headedness, numbness and headaches.  Psychiatric/Behavioral: Positive for suicidal ideas.    Physical Exam Updated Vital Signs BP (!) 141/99 (BP Location: Left Arm)    Pulse 60    Temp 97.8 F (36.6 C) (Oral)    Resp 18    SpO2 99%   Physical Exam Vitals and nursing note reviewed.  Constitutional:      Appearance: He is well-developed. He is not ill-appearing.     Comments: Non ill appearing, non toxic.   HENT:     Head: Normocephalic and atraumatic.  Eyes:     General: No scleral icterus.    Pupils: Pupils are equal, round, and reactive to light.  Cardiovascular:     Heart sounds: Normal heart sounds.  Pulmonary:     Effort: Pulmonary effort is normal.     Breath sounds: Normal breath sounds. No wheezing.  Chest:     Chest wall: No tenderness.  Abdominal:  General: Bowel sounds are decreased. There is no distension.     Palpations: Abdomen is soft.     Tenderness: There is abdominal tenderness in the right upper quadrant and epigastric area. There is no right CVA tenderness, left CVA tenderness or guarding. Negative signs include Murphy's sign.     Comments: Bowel sounds are present and normal. TTP along the RUQ with no radiation.   Musculoskeletal:        General: No tenderness or deformity.     Cervical back: Normal range of motion.  Skin:    General: Skin is warm and dry.  Neurological:     Mental Status: He is alert and oriented to person, place, and time.     ED Results / Procedures / Treatments   Labs (all labs ordered are  listed, but only abnormal results are displayed) Labs Reviewed  COMPREHENSIVE METABOLIC PANEL - Abnormal; Notable for the following components:      Result Value   CO2 21 (*)    Creatinine, Ser 1.77 (*)    AST 43 (*)    ALT 58 (*)    GFR calc non Af Amer 53 (*)    All other components within normal limits  CBC - Abnormal; Notable for the following components:   RBC 6.29 (*)    Hemoglobin 19.0 (*)    HCT 56.4 (*)    All other components within normal limits  URINALYSIS, ROUTINE W REFLEX MICROSCOPIC - Abnormal; Notable for the following components:   Protein, ur 100 (*)    All other components within normal limits  LIPASE, BLOOD  RAPID URINE DRUG SCREEN, HOSP PERFORMED    EKG None  Radiology CT ABDOMEN PELVIS W CONTRAST  Result Date: 08/19/2019 CLINICAL DATA:  Abdominal pain and tenderness. Nausea and vomiting. EXAM: CT ABDOMEN AND PELVIS WITH CONTRAST TECHNIQUE: Multidetector CT imaging of the abdomen and pelvis was performed using the standard protocol following bolus administration of intravenous contrast. CONTRAST:  OMNIPAQUE IOHEXOL 300 MG/ML  SOLN COMPARISON:  None. FINDINGS: Lower chest: The patient has extensive emphysematous changes in both lung bases some of which is paraseptal and some of which is central lobular. Does the patient have a history of alpha 1 antitrypsin deficiency? Heart size is normal. No effusions. Hepatobiliary: No focal liver abnormality is seen. No gallstones, gallbladder wall thickening, or biliary dilatation. Pancreas: Unremarkable. No pancreatic ductal dilatation or surrounding inflammatory changes. Spleen: Normal in size without focal abnormality. Adrenals/Urinary Tract: Adrenal glands are unremarkable. Kidneys are normal, without renal calculi, focal lesion, or hydronephrosis. Bladder is unremarkable. Stomach/Bowel: Stomach is within normal limits. Appendix appears normal. No evidence of bowel wall thickening, distention, or inflammatory changes.  Vascular/Lymphatic: No significant vascular findings are present. No enlarged abdominal or pelvic lymph nodes. Reproductive: Prostate is unremarkable. Other: No abdominal wall hernia or abnormality. No abdominopelvic ascites. Musculoskeletal: No acute or significant osseous findings. IMPRESSION: 1. No acute abnormalities of the abdomen or pelvis. 2. Extensive emphysematous changes in both lung bases. The possibility of alpha 1 antitrypsin deficiency should be considered. Was the patient premature and in the in ICU as a newborn? Emphysema (ICD10-J43.9). Electronically Signed   By: Francene Boyers M.D.   On: 08/19/2019 17:16   US Abdomen Limited  Result Date: 08/19/2019 CLINICAL DATA:  Right upper quadrant pain. EXAM: ULTRASOUND ABDOMEN LIMITED RIGHT UPPER QUADRANT COMPARISON:  Ultrasound dated 04/06/2017 FINDINGS: Gallbladder: No gallstones or wall thickening visualized. No sonographic Murphy sign noted by sonographer. Common bile duct: Diameter:  2 mm, normal. Liver: No focal lesion identified. Within normal limits in parenchymal echogenicity. Portal vein is patent on color Doppler imaging with normal direction of blood flow towards the liver. Other: Echogenic right renal parenchyma consistent with renal medical disease. IMPRESSION: No acute abnormality. Echogenic renal parenchyma consistent with renal medical disease. Electronically Signed   By: Francene Boyers M.D.   On: 08/19/2019 15:37    Procedures Procedures (including critical care time)  Medications Ordered in ED Medications  sodium chloride flush (NS) 0.9 % injection 3 mL (3 mLs Intravenous Given 08/19/19 1430)  ondansetron (ZOFRAN) injection 4 mg (4 mg Intravenous Given 08/19/19 1430)  sodium chloride 0.9 % bolus 1,000 mL (0 mLs Intravenous Stopped 08/19/19 1617)  promethazine (PHENERGAN) injection 12.5 mg (12.5 mg Intravenous Given 08/19/19 1640)  iohexol (OMNIPAQUE) 300 MG/ML solution 100 mL (100 mLs Intravenous Contrast Given 08/19/19 1655)    ED  Course  I have reviewed the triage vital signs and the nursing notes.  Pertinent labs & imaging results that were available during my care of the patient were reviewed by me and considered in my medical decision making (see chart for details).    MDM Rules/Calculators/A&P   Patient with no pertinent past medical history presents to the ED with complaints of abdominal pain along with suicidal ideations.  She reports has been under a lot of stress lately, has had 3 losses in the past and has had some suicidal ideations, has never had a plan.  No HI, hallucinations.  He also did have 3 episodes of emesis prior to arrival, nonbloody, nonbilious, reports he feels like he is somewhat having panic attacks, does have a previous history of anxiety.   CBC without any leukocytosis, hemoglobin is elevated, he is hemoconcentrated.  Lipase level is within normal limits.  CMP without any electrolyte abnormality, creatinine level is elevated, this is consistent with patient's previous baseline.  Slight elevation of his LFTs, he denies any alcohol use, does complain of some right upper quadrant pain, some suspicion for gallbladder pathology.  She was provided with Zofran along with a liter bolus, had a third episode of emesis while in the ED.  Phenergan to help with his symptoms.  Ultrasound of the right upper quadrant did not show any stones, dilation, negative Murphy sign.  CT of his abdomen was also ordered in order to evaluate further pathology CT showed: No acute appendicitis, intra abdominal abnormalities.  P.o. challenge was ordered for patient.  He will also need TTS consultation pending UDS.  9:59 PM TS consultation has been completed, attempting to call [facility] without any response.  10:13 PM Spoke to NP [employer], who has psychiatrically cleared patient.  BH H resources will be faxed to emergency department order for patient to set up outpatient management.    Portions of this note were generated  with Scientist, clinical (histocompatibility and immunogenetics). Dictation errors may occur despite best attempts at proofreading.  Final Clinical Impression(s) / ED Diagnoses Final diagnoses:  Intractable vomiting with nausea, unspecified vomiting type  Suicidal ideations    Rx / DC Orders ED Discharge Orders    None       Claude Manges, Cordelia Poche 08/19/19 2233    Wynetta Fines, MD 08/23/19 1406

## 2019-08-19 NOTE — Discharge Instructions (Addendum)
Your laboratory results are within normal limits.  The CT of your abdomen did not show any intra-abdominal abnormalities.  You received a consultation with behavioral health,

## 2019-08-19 NOTE — ED Notes (Signed)
Pt to ultrasound

## 2019-08-19 NOTE — ED Notes (Signed)
Patient denies pain and is resting comfortably.  

## 2019-08-19 NOTE — BH Assessment (Signed)
Tele Assessment Note   Patient Name: [name] MRN: [id number] Referring Physician: Lars Pinks, PA-C. Location of Patient: [facility], [phone]. Location of Provider: Behavioral Health TTS Department  [name] is an 24 y.o. male, who presents voluntary and unaccompanied to [facility]. Clinician asked the pt, "what brought you to the hospital?" Pt reported, thinking about stuff and started throwing up, crying and had a panic attack. Pt reported, he's been having panic attacks since his father died. Pt reported, his best friend died in 10/13/2020his father died just after the New Year and another friend died two week after his father. Pt reported, he had thoughts of dying and killing himself with no plan, or access to weapons. Pt reported, a couple days ago, having thoughts of wanting to hurt people that get on his nerves. Pt reported, at night he hears voices telling him negative things. Pt reported, he feels like his girlfriend is going to leave him. Pt reported, he does not know where he and his girlfriend stand. Pt denies, current SI, HI, self-injurious behaviors and access to weapons.   Pt gave consent for clinician to speak to his mother [name], [phone]). Pt's mother reported, she does not feel the pt will hurt himself or others. Pt's mother reported, the pt has been depressed since his father died. Pt's mother reported, the pt's sleep is off. Pt's mother reported, if discharge she feels the pt will be safe at home.   Pt denies, substance use. Pt's UDS is pending. Pt denies, being linked to OPT resources (medication management and/or counseling.) Pt denies, previous inpatient admissions.   Pt presents alert in hospital gown with logical, coherent speech. Pt's eye contact was good. Pt's mood was pleasant, depressed. Pt's affect was congruent with mood. Pt's thought process was coherent, relevant. Pt was oriented x4. Pt's concentration was normal. Pt's insight and impulse control  was fair. Pt reported, if discharged from [facility] he could contract for safety. Clinician discussed the three possible dispositions (discharged with OPT resources, observe/reassess by psychiatry or inpatient treatment) in detail.    Diagnosis: Major Depressive Disorder, single, severe with psychotic features.   Past Medical History:  Past Medical History:  Diagnosis Date  . Asthma   . Renal disorder     No past surgical history on file.  Family History: No family history on file.  Social History:  reports that he has been smoking. He has never used smokeless tobacco. He reports that he does not drink alcohol or use drugs.  Additional Social History:  Alcohol / Drug Use Pain Medications: See MAR Prescriptions: See MAR Over the Counter: See MAR History of alcohol / drug use?: No history of alcohol / drug abuse(UDS is pending.)  CIWA: CIWA-Ar BP: (!) 149/88 Pulse Rate: 60 COWS:    Allergies:  Allergies  Allergen Reactions  . Amoxicillin Nausea And Vomiting    Has patient had a PCN reaction causing immediate rash, facial/tongue/throat swelling, SOB or lightheadedness with hypotension: unknown Has patient had a PCN reaction causing severe rash involving mucus membranes or skin necrosis: unknown Has patient had a PCN reaction that required hospitalization: No Has patient had a PCN reaction occurring within the last 10 years: unknown If all of the above answers are "NO", then may proceed with Cephalosporin use.     Home Medications: (Not in a hospital admission)   OB/GYN Status:  No LMP for male patient.  General Assessment Data Location of Assessment: [facility] ED TTS Assessment: In  system Is this a Tele or Face-to-Face Assessment?: Tele Assessment Is this an Initial Assessment or a Re-assessment for this encounter?: Initial Assessment Patient Accompanied by:: N/A Language Other than English: No Living Arrangements: Other (Comment)(Mother. ) What gender do you identify as?:  Male Marital status: Single Living Arrangements: Parent Can pt return to current living arrangement?: Yes Admission Status: Voluntary Is patient capable of signing voluntary admission?: Yes Referral Source: Self/Family/Friend Insurance type: Medicaid.      Crisis Care Plan Living Arrangements: Parent Legal Guardian: Other:(Self. ) Name of Psychiatrist: NA Name of Therapist: NA  Education Status Is patient currently in school?: No Is the patient employed, unemployed or receiving disability?: Unemployed  Risk to self with the past 6 months Suicidal Ideation: No-Not Currently/Within Last 6 Months Has patient been a risk to self within the past 6 months prior to admission? : Yes Suicidal Intent: No(Pt denies. ) Has patient had any suicidal intent within the past 6 months prior to admission? : No Is patient at risk for suicide?: No Suicidal Plan?: No(Pt denies. ) Has patient had any suicidal plan within the past 6 months prior to admission? : No Access to Means: No(Pt denies. ) What has been your use of drugs/alcohol within the last 12 months?: UDS is pending.  Previous Attempts/Gestures: No(Pt denies. ) How many times?: 0 Other Self Harm Risks: NA Triggers for Past Attempts: None known Intentional Self Injurious Behavior: None(Pt denies. ) Family Suicide History: No Recent stressful life event(s): Loss (Comment), Other (Comment)(father, and two friends diedm think gf is going to leave him) Persecutory voices/beliefs?: No(Pt denies. ) Depression: Yes Depression Symptoms: Feeling angry/irritable, Feeling worthless/self pity, Loss of interest in usual pleasures, Tearfulness, Insomnia, Despondent Substance abuse history and/or treatment for substance abuse?: No Suicide prevention information given to non-admitted patients: Not applicable  Risk to Others within the past 6 months Homicidal Ideation: No(Pt denies. ) Does patient have any lifetime risk of violence toward others  beyond the six months prior to admission? : No(Pt denies. ) Thoughts of Harm to Others: No-Not Currently Present/Within Last 6 Months Current Homicidal Intent: No(Pt denies. ) Current Homicidal Plan: No Access to Homicidal Means: No Identified Victim: NA History of harm to others?: No(Pt denies. ) Assessment of Violence: None Noted Violent Behavior Description: NA Does patient have access to weapons?: No(Pt denies. ) Criminal Charges Pending?: No Does patient have a court date: No Is patient on probation?: No  Psychosis Hallucinations: Auditory Delusions: None noted  Mental Status Report Appearance/Hygiene: In hospital gown Eye Contact: Good Motor Activity: Unremarkable Speech: Logical/coherent Level of Consciousness: Alert Mood: Pleasant, Depressed Affect: Other (Comment)(congruent with mood. ) Anxiety Level: Panic Attacks Panic attack frequency: Since the passing of his father in January 2021. Most recent panic attack: Today (08/19/2019) Thought Processes: Coherent, Relevant Judgement: Partial Orientation: Person, Place, Time, Situation Obsessive Compulsive Thoughts/Behaviors: None  Cognitive Functioning Concentration: Normal Memory: Recent Intact Is patient IDD: No Insight: Fair Impulse Control: Fair Appetite: Poor Sleep: Decreased Total Hours of Sleep: 3 Vegetative Symptoms: Staying in bed  ADLScreening [facility] Assessment Services) Patient's cognitive ability adequate to safely complete daily activities?: Yes Patient able to express need for assistance with ADLs?: Yes Independently performs ADLs?: Yes (appropriate for developmental age)  Prior Inpatient Therapy Prior Inpatient Therapy: No  Prior Outpatient Therapy Prior Outpatient Therapy: No Does patient have an ACCT team?: No Does patient have Intensive In-House Services?  : No Does patient have [facility] services? : No Does patient have P4CC services?:  No  ADL Screening (condition at time of  admission) Patient's cognitive ability adequate to safely complete daily activities?: Yes Is the patient deaf or have difficulty hearing?: No Does the patient have difficulty seeing, even when wearing glasses/contacts?: No Does the patient have difficulty concentrating, remembering, or making decisions?: No Patient able to express need for assistance with ADLs?: Yes Does the patient have difficulty dressing or bathing?: No Independently performs ADLs?: Yes (appropriate for developmental age) Does the patient have difficulty walking or climbing stairs?: No Weakness of Legs: None(Pt reported, stomach pain.) Weakness of Arms/Hands: None  Home Assistive Devices/Equipment Home Assistive Devices/Equipment: None    Abuse/Neglect Assessment (Assessment to be complete while patient is alone) Abuse/Neglect Assessment Can Be Completed: Yes Physical Abuse: Denies Verbal Abuse: Denies Sexual Abuse: Denies Exploitation of patient/patient's resources: Denies Self-Neglect: Denies     Merchant navy officer (For Healthcare) Does Patient Have a Medical Advance Directive?: No          Disposition: Lerry Liner, NP recommends pt does not meet inpatient treatment criteria. Disposition discussed with Heidi, RN. OPT resources has been faxed to [place], Charity fundraiser.    Disposition Initial Assessment Completed for this Encounter: Yes  This service was provided via telemedicine using a 2-way, interactive audio and video technology.  Names of all persons participating in this telemedicine service and their role in this encounter. Name: [name]. Role: Patient.  Name: Redmond Pulling, MS, [facility]. Role: Counselor.   Name: [name] (via phone) Role: Mother.       Redmond Pulling 08/19/2019 9:57 PM    Redmond Pulling, MS, [facility], [facility] Triage Specialist [phone]

## 2019-08-19 NOTE — ED Notes (Signed)
TTS has called and stated that they will be ready for pt interview in next 10 minutes. Computer in room, pt instructed to accept call when it comes.

## 2019-08-19 NOTE — ED Triage Notes (Signed)
Pt has recently lost two close family members, having increased panic attacks and depression since then. This morning woke up had two episodes of vomiting with abdominal pain, tender on palpation.

## 2019-08-19 NOTE — ED Notes (Signed)
Pt PO challenge successful. Pt was able to tolerate can of lemon lime soda. TTS robot in room awaiting for teleconsult.

## 2020-03-31 ENCOUNTER — Ambulatory Visit (HOSPITAL_COMMUNITY)
Admission: EM | Admit: 2020-03-31 | Discharge: 2020-03-31 | Disposition: A | Discharge status: Home / Self Care | Payer: Medicaid Other | Attending: Family Medicine | Admitting: Family Medicine

## 2020-03-31 ENCOUNTER — Encounter (HOSPITAL_COMMUNITY): Payer: Self-pay | Admitting: Emergency Medicine

## 2020-03-31 ENCOUNTER — Other Ambulatory Visit: Payer: Self-pay

## 2020-03-31 DIAGNOSIS — Z113 Encounter for screening for infections with a predominantly sexual mode of transmission: Secondary | ICD-10-CM | POA: Diagnosis not present

## 2020-03-31 LAB — HIV ANTIBODY (ROUTINE TESTING W REFLEX): HIV Screen 4th Generation wRfx: NONREACTIVE

## 2020-03-31 LAB — RPR: RPR Ser Ql: NONREACTIVE

## 2020-03-31 NOTE — ED Triage Notes (Signed)
Pt presents for STD test. He denies any symptoms. He is asking for HIV testing as well.

## 2020-03-31 NOTE — Discharge Instructions (Addendum)
You can check my chart for results

## 2020-04-01 NOTE — ED Provider Notes (Signed)
[facility]    CSN: [id number] Arrival date & time: 03/31/20  2440      History   Chief Complaint Chief Complaint  Patient presents with  . SEXUALLY TRANSMITTED DISEASE    HPI [name] is a 24 y.o. male.   Patient is a 24 year old male with past medical history of asthma and renal disorder.  He presents today for STD screening.  Denies any current symptoms.  Wanting HIV and syphilis testing as well.  Denies any symptoms currently.     Past Medical History:  Diagnosis Date  . Asthma   . Renal disorder     There are no problems to display for this patient.   History reviewed. No pertinent surgical history.     Home Medications    Prior to Admission medications   Medication Sig Start Date End Date Taking? Authorizing Provider  losartan (COZAAR) 50 MG tablet Take 1 tablet by mouth daily.  12/23/14   [provider]  Vitamin D, Ergocalciferol, (DRISDOL) 50000 units CAPS capsule Take 1 capsule by mouth once a week. 03/04/17   [provider]    Family History Family History  Family history unknown: Yes    Social History Social History   Tobacco Use  . Smoking status: Never Smoker  . Smokeless tobacco: Never Used  Substance Use Topics  . Alcohol use: No  . Drug use: No     Allergies   Amoxicillin   Review of Systems Review of Systems   Physical Exam Triage Vital Signs ED Triage Vitals  Enc Vitals Group     BP 03/31/20 0837 (!) 139/98     Pulse Rate 03/31/20 0837 70     Resp 03/31/20 0837 16     Temp 03/31/20 0837 98.4 F (36.9 C)     Temp Source 03/31/20 0837 Oral     SpO2 03/31/20 0837 99 %     Weight --      Height --      Head Circumference --      Peak Flow --      Pain Score 03/31/20 0834 0     Pain Loc --      Pain Edu? --      Excl. in GC? --    No data found.  Updated Vital Signs BP (!) 139/98 (BP Location: Right Arm)   Pulse 70   Temp 98.4 F (36.9 C) (Oral)   Resp 16   SpO2 99%    Visual Acuity Right Eye Distance:   Left Eye Distance:   Bilateral Distance:    Right Eye Near:   Left Eye Near:    Bilateral Near:     Physical Exam Vitals and nursing note reviewed.  Constitutional:      Appearance: Normal appearance.  HENT:     Head: Normocephalic and atraumatic.  Eyes:     Conjunctiva/sclera: Conjunctivae normal.  Pulmonary:     Effort: Pulmonary effort is normal.  Musculoskeletal:        General: Normal range of motion.     Cervical back: Normal range of motion.  Skin:    General: Skin is warm and dry.  Neurological:     Mental Status: He is alert.  Psychiatric:        Mood and Affect: Mood normal.      UC Treatments / Results  Labs (all labs ordered are listed, but only abnormal results are displayed) Labs Reviewed  HIV ANTIBODY (ROUTINE TESTING  W REFLEX)  RPR  CYTOLOGY, (ORAL, ANAL, URETHRAL) ANCILLARY ONLY    EKG   Radiology No results found.  Procedures Procedures (including critical care time)  Medications Ordered in UC Medications - No data to display  Initial Impression / Assessment and Plan / UC Course  I have reviewed the triage vital signs and the nursing notes.  Pertinent labs & imaging results that were available during my care of the patient were reviewed by me and considered in my medical decision making (see chart for details).     Screening for STDs and asymptomatic MyChart for results. Final Clinical Impressions(s) / UC Diagnoses   Final diagnoses:  Screening for STDs (sexually transmitted diseases)     Discharge Instructions     You can check my chart for results     ED Prescriptions    None     PDMP not reviewed this encounter.   Janace Aris, NP 04/01/20 1118

## 2020-04-03 LAB — CYTOLOGY, (ORAL, ANAL, URETHRAL) ANCILLARY ONLY
Chlamydia: NEGATIVE
Comment: NEGATIVE
Comment: NEGATIVE
Comment: NORMAL
Neisseria Gonorrhea: NEGATIVE
Trichomonas: NEGATIVE

## 2020-05-06 IMAGING — CT CT ABD-PELV W/ CM
2 of 4 series · 16 of 46 positions shown, 18 images · IV contrast (omnipaque)
Comparison: None.

CLINICAL DATA: Abdominal pain and tenderness. Nausea and vomiting.

EXAM:
CT ABDOMEN AND PELVIS WITH CONTRAST
TECHNIQUE: Multidetector CT imaging of the abdomen and pelvis was performed
using the standard protocol following bolus administration of
intravenous contrast.
CONTRAST:  100mL OMNIPAQUE IOHEXOL 300 MG/ML  SOLN

[Series 3: abdomen 5.0 · axial · 0.73mm/px · z∈[-552,-127]mm · 13 of 96 slices shown, 15 images]
[im 6/96  soft-tissue]
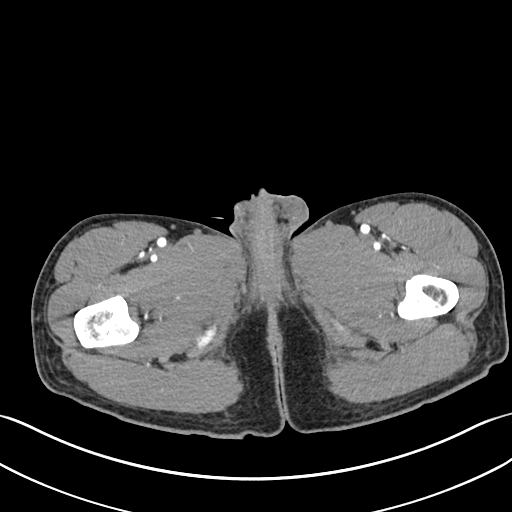
[im 6/96  bone]
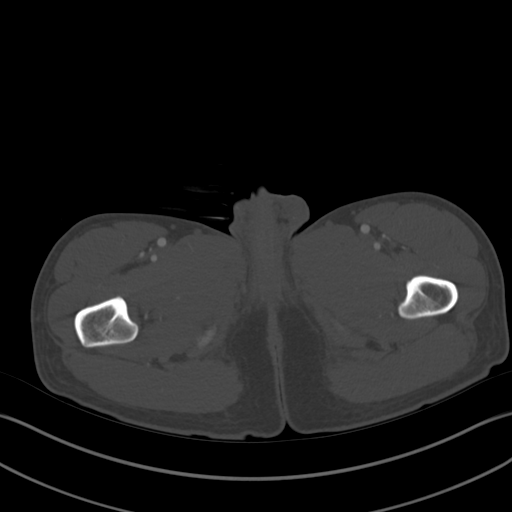
[im 16/96  soft-tissue]
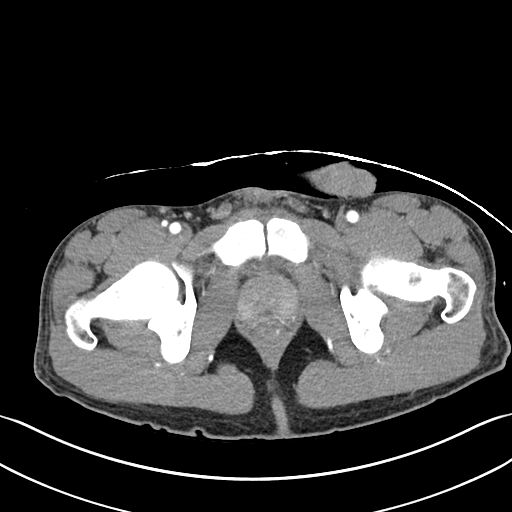
[im 21/96  soft-tissue]
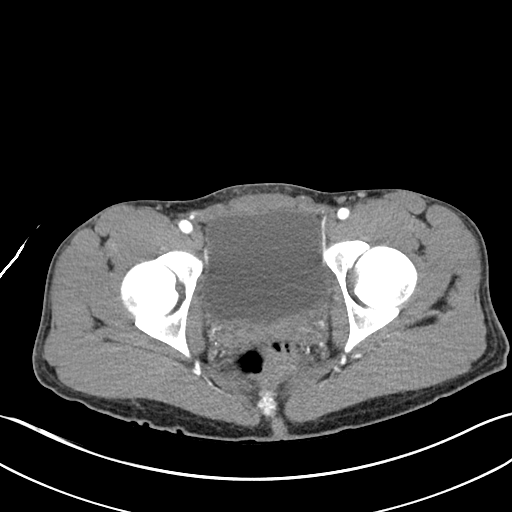
[im 26/96  soft-tissue]
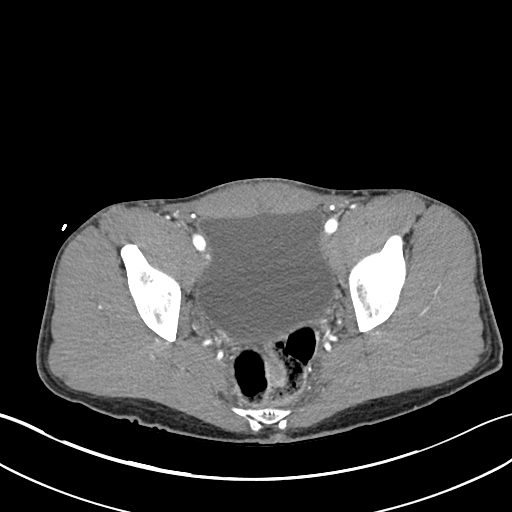
[im 36/96  soft-tissue]
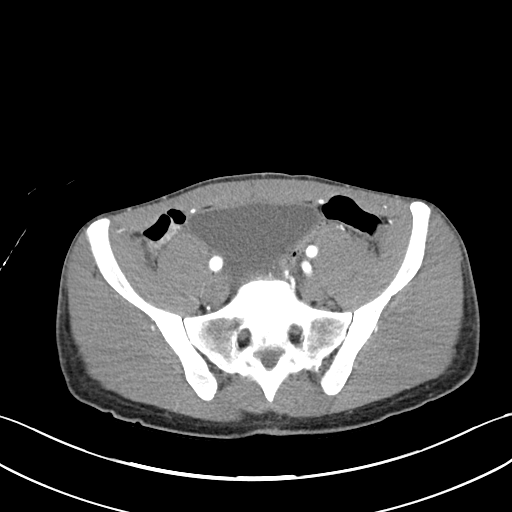
[im 41/96  soft-tissue]
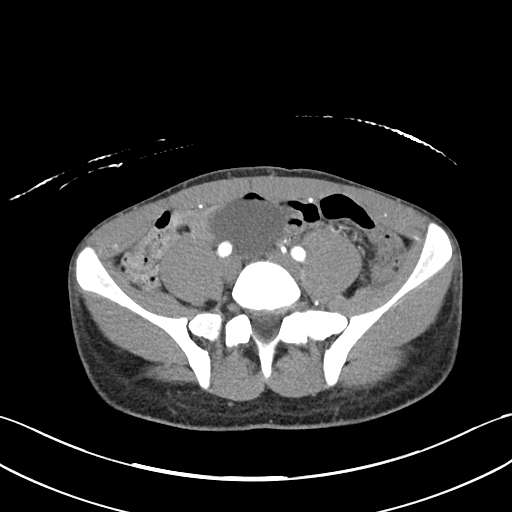
[im 51/96  soft-tissue]
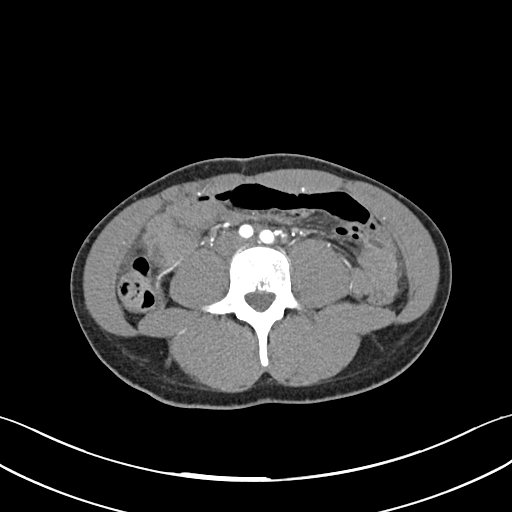
[im 56/96  soft-tissue]
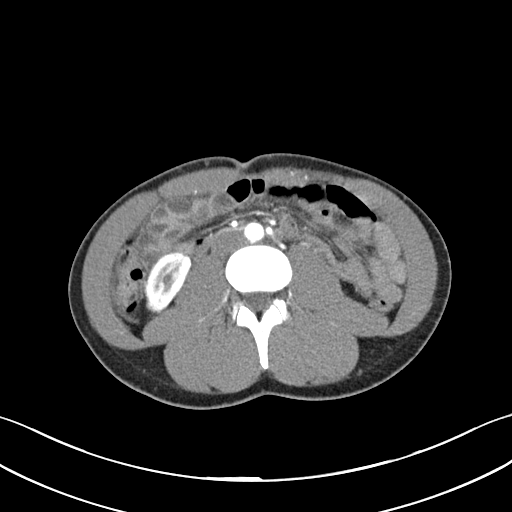
[im 61/96  soft-tissue]
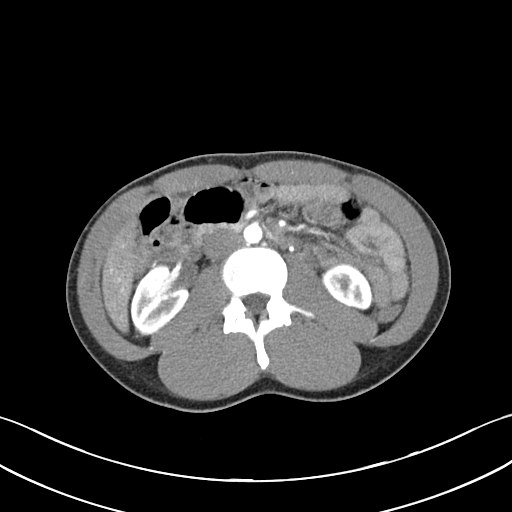
[im 61/96  bone]
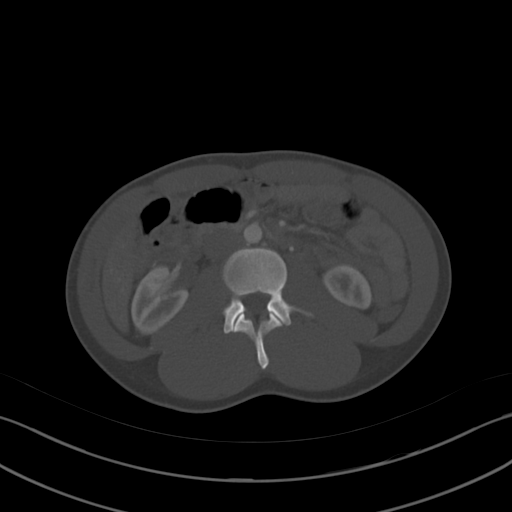
[im 71/96  soft-tissue]
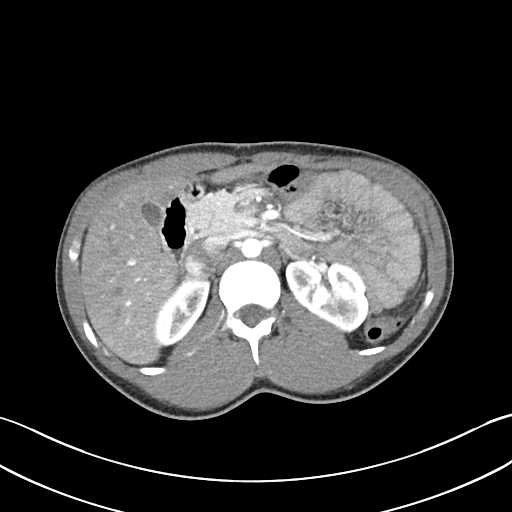
[im 76/96  soft-tissue]
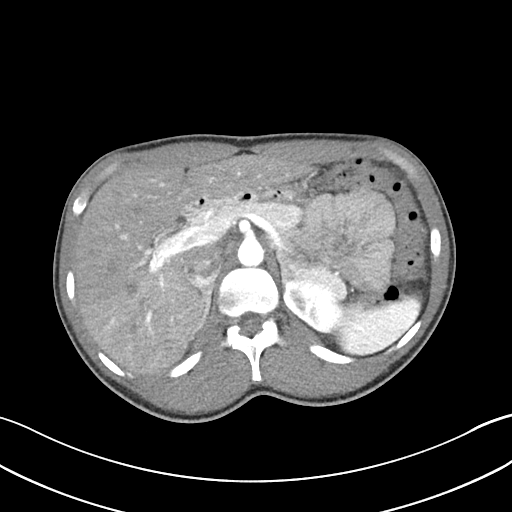
[im 81/96  soft-tissue]
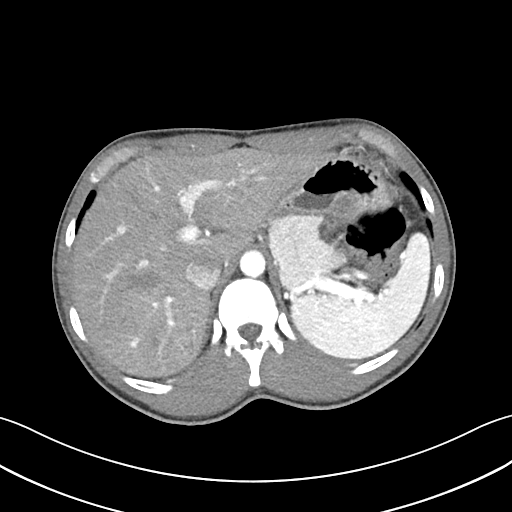
[im 91/96  soft-tissue]
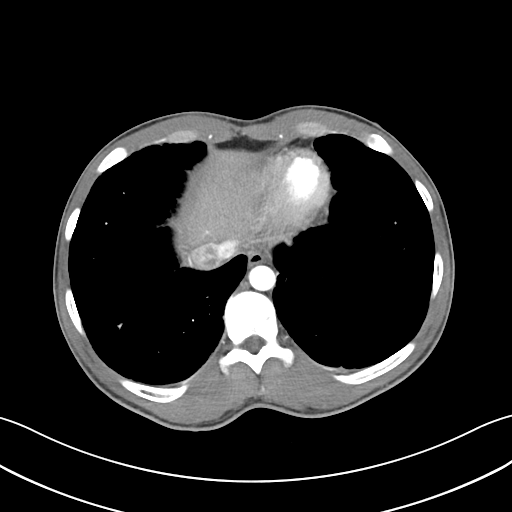

[Series 6: abdomen 3.0 mpr cor · coronal · 0.71mm/px · 3 of 85 slices shown]
[im 29/85  soft-tissue]
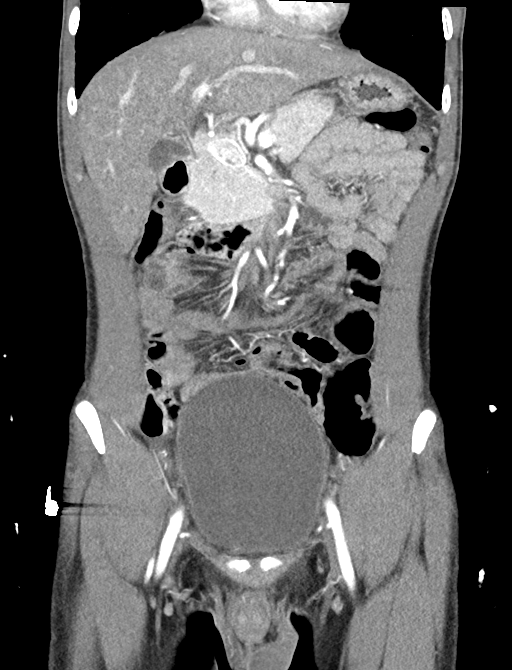
[im 38/85  soft-tissue]
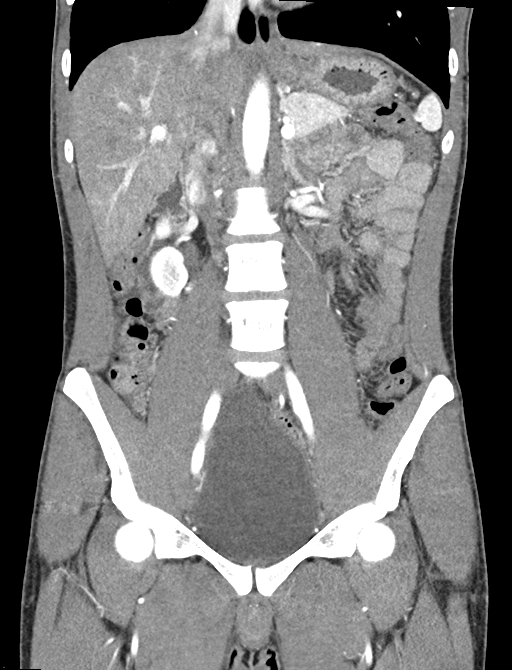
[im 47/85  soft-tissue]
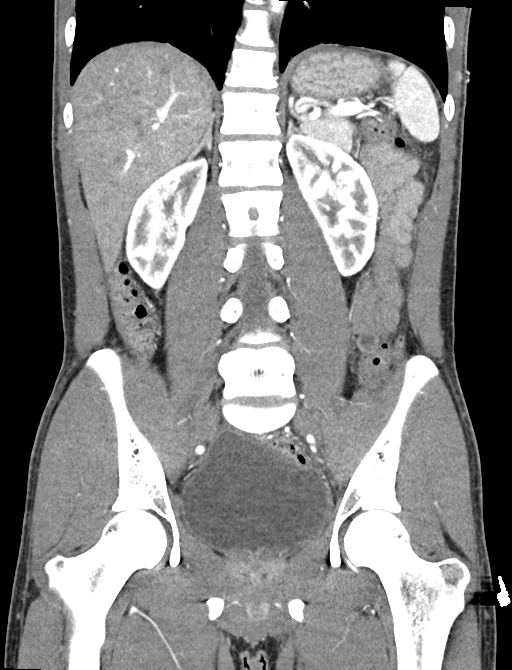

[16 of 46 positions shown; findings below may reference images not displayed]

FINDINGS: Lower chest: The patient has extensive emphysematous changes in both
lung bases some of which is paraseptal and some of which is central
lobular. Does the patient have a history of alpha 1 antitrypsin
deficiency? Heart size is normal. No effusions.

Hepatobiliary: No focal liver abnormality is seen. No gallstones,
gallbladder wall thickening, or biliary dilatation.

Pancreas: Unremarkable. No pancreatic ductal dilatation or
surrounding inflammatory changes.

Spleen: Normal in size without focal abnormality.

Adrenals/Urinary Tract: Adrenal glands are unremarkable. Kidneys are
normal, without renal calculi, focal lesion, or hydronephrosis.
Bladder is unremarkable.

Stomach/Bowel: Stomach is within normal limits. Appendix appears
normal. No evidence of bowel wall thickening, distention, or
inflammatory changes.

Vascular/Lymphatic: No significant vascular findings are present. No
enlarged abdominal or pelvic lymph nodes.

Reproductive: Prostate is unremarkable.

Other: No abdominal wall hernia or abnormality. No abdominopelvic
ascites.

Musculoskeletal: No acute or significant osseous findings.
IMPRESSION: 1. No acute abnormalities of the abdomen or pelvis.
2. Extensive emphysematous changes in both lung bases. The
possibility of alpha 1 antitrypsin deficiency should be considered.
Was the patient premature and in the in ICU as a newborn?

Emphysema ([id number]).

## 2020-08-23 ENCOUNTER — Emergency Department (HOSPITAL_COMMUNITY): Payer: Medicaid Other

## 2020-08-23 ENCOUNTER — Emergency Department (HOSPITAL_COMMUNITY)
Admission: EM | Admit: 2020-08-23 | Discharge: 2020-08-23 | Disposition: A | Discharge status: Home / Self Care | Payer: Medicaid Other | Attending: Emergency Medicine | Admitting: Emergency Medicine

## 2020-08-23 ENCOUNTER — Other Ambulatory Visit: Payer: Self-pay

## 2020-08-23 ENCOUNTER — Encounter (HOSPITAL_COMMUNITY): Payer: Self-pay | Admitting: Emergency Medicine

## 2020-08-23 DIAGNOSIS — W010XXA Fall on same level from slipping, tripping and stumbling without subsequent striking against object, initial encounter: Secondary | ICD-10-CM | POA: Insufficient documentation

## 2020-08-23 DIAGNOSIS — I10 Essential (primary) hypertension: Secondary | ICD-10-CM | POA: Diagnosis not present

## 2020-08-23 DIAGNOSIS — N289 Disorder of kidney and ureter, unspecified: Secondary | ICD-10-CM | POA: Diagnosis not present

## 2020-08-23 DIAGNOSIS — S0081XA Abrasion of other part of head, initial encounter: Secondary | ICD-10-CM | POA: Diagnosis not present

## 2020-08-23 DIAGNOSIS — R0789 Other chest pain: Secondary | ICD-10-CM | POA: Insufficient documentation

## 2020-08-23 DIAGNOSIS — S0990XA Unspecified injury of head, initial encounter: Secondary | ICD-10-CM | POA: Diagnosis present

## 2020-08-23 LAB — BASIC METABOLIC PANEL
Anion gap: 16 — ABNORMAL HIGH (ref 5–15)
BUN: 17 mg/dL (ref 6–20)
CO2: 17 mmol/L — ABNORMAL LOW (ref 22–32)
Calcium: 9.2 mg/dL (ref 8.9–10.3)
Chloride: 104 mmol/L (ref 98–111)
Creatinine, Ser: 2.09 mg/dL — ABNORMAL HIGH (ref 0.61–1.24)
GFR, Estimated: 45 mL/min — ABNORMAL LOW (ref >60)
Glucose, Bld: 123 mg/dL — ABNORMAL HIGH (ref 70–99)
Potassium: 3.6 mmol/L (ref 3.5–5.1)
Sodium: 137 mmol/L (ref 135–145)

## 2020-08-23 LAB — CBC
HCT: 56 % — ABNORMAL HIGH (ref 39.0–52.0)
Hemoglobin: 19.4 g/dL — ABNORMAL HIGH (ref 13.0–17.0)
MCH: 30 pg (ref 26.0–34.0)
MCHC: 34.6 g/dL (ref 30.0–36.0)
MCV: 86.6 fL (ref 80.0–100.0)
Platelets: 256 10*3/uL (ref 150–400)
RBC: 6.47 MIL/uL — ABNORMAL HIGH (ref 4.22–5.81)
RDW: 13.2 % (ref 11.5–15.5)
WBC: 6.2 10*3/uL (ref 4.0–10.5)
nRBC: 0 % (ref 0.0–0.2)

## 2020-08-23 LAB — TROPONIN I (HIGH SENSITIVITY)
Troponin I (High Sensitivity): 11 ng/L (ref <18)
Troponin I (High Sensitivity): 13 ng/L (ref <18)

## 2020-08-23 MED ORDER — LOSARTAN POTASSIUM 50 MG PO TABS: 50.0000 mg | ORAL_TABLET | Freq: Every day | ORAL | 0 refills | Status: AC

## 2020-08-23 MED ORDER — LOSARTAN POTASSIUM 50 MG PO TABS: 50.0000 mg | ORAL_TABLET | Freq: Every day | ORAL | 0 refills | Status: DC

## 2020-08-23 MED ORDER — LOSARTAN POTASSIUM 50 MG PO TABS
50.0000 mg | ORAL_TABLET | ORAL | Status: AC
  Administered 2020-08-23: 50 mg via ORAL
  Filled 2020-08-23: qty 1

## 2020-08-23 NOTE — ED Provider Notes (Signed)
[facility] EMERGENCY DEPARTMENT Provider Note   CSN: [id number] Arrival date & time: 08/23/20  0725     History Chief Complaint  Patient presents with  . Chest Pain    Tycho Cheramie is a 25 y.o. male.  HPI    25 yo male history of renal insufficiency, hypertension, presents today with facial pain and chest pain after fall. Patient fell face forward last night.  He denies other injury.  He is currently homeless but does have family here who can come get him.  No loc, no neck pain, no dyspnea, fever, cough or chills.  Past Medical History:  Diagnosis Date  . Asthma   . Renal disorder     There are no problems to display for this patient.   History reviewed. No pertinent surgical history.     Family History  Family history unknown: Yes    Social History   Tobacco Use  . Smoking status: Never Smoker  . Smokeless tobacco: Never Used  Substance Use Topics  . Alcohol use: No  . Drug use: No    Home Medications Prior to Admission medications   Medication Sig Start Date End Date Taking? Authorizing Provider  losartan (COZAAR) 50 MG tablet Take 1 tablet by mouth daily.  12/23/14   [provider]  Vitamin D, Ergocalciferol, (DRISDOL) 50000 units CAPS capsule Take 1 capsule by mouth once a week. 03/04/17   [provider]    Allergies    Amoxicillin  Review of Systems   Review of Systems  All other systems reviewed and are negative.   Physical Exam Updated Vital Signs BP (!) 160/105   Pulse 76   Temp 98.6 F (37 C) (Oral)   Resp (!) 24   SpO2 100%   Physical Exam Vitals and nursing note reviewed.  Constitutional:      Appearance: He is well-developed.  HENT:     Head: Normocephalic and atraumatic.     Comments: Abrasion right side of face No tenderness palpation over bones no bony crepitus Teeth aligned    Right Ear: External ear normal.     Left Ear: External ear normal.     Nose: Nose normal.  Eyes:      Extraocular Movements: EOM normal.  Neck:     Trachea: No tracheal deviation.  Cardiovascular:     Rate and Rhythm: Normal rate and regular rhythm.     Heart sounds: Normal heart sounds.     Comments: Mild anterior chest tenderness to palpation Pulmonary:     Effort: Pulmonary effort is normal.  Musculoskeletal:        General: Normal range of motion.     Cervical back: Normal range of motion.  Skin:    General: Skin is warm and dry.  Neurological:     Mental Status: He is alert and oriented to person, place, and time.  Psychiatric:        Mood and Affect: Mood and affect normal.        Behavior: Behavior normal.     ED Results / Procedures / Treatments   Labs (all labs ordered are listed, but only abnormal results are displayed) Labs Reviewed  BASIC METABOLIC PANEL - Abnormal; Notable for the following components:      Result Value   CO2 17 (*)    Glucose, Bld 123 (*)    Creatinine, Ser 2.09 (*)    GFR, Estimated 45 (*)    Anion gap 16 (*)  All other components within normal limits  CBC - Abnormal; Notable for the following components:   RBC 6.47 (*)    Hemoglobin 19.4 (*)    HCT 56.0 (*)    All other components within normal limits  TROPONIN I (HIGH SENSITIVITY)  TROPONIN I (HIGH SENSITIVITY)    EKG EKG Interpretation  Date/Time:  Sunday August 23 2020 07:38:13 EST Ventricular Rate:  92 PR Interval:  140 QRS Duration: 82 QT Interval:  358 QTC Calculation: 442 R Axis:   -78 Text Interpretation: Normal sinus rhythm Right atrial enlargement Left axis deviation Pulmonary disease pattern Nonspecific T wave abnormality Abnormal ECG Confirmed by Margarita Grizzle [phone]) on 08/23/2020 8:47:28 AM   Radiology DG Chest 2 View  Result Date: 08/23/2020 CLINICAL DATA:  Chest pain EXAM: CHEST - 2 VIEW COMPARISON:  July 30, 2018 FINDINGS: There is mild apparent scarring in the medial right upper lobe. Lungs elsewhere are clear. Heart size and pulmonary vascularity are  normal. No adenopathy. No bone lesions. No pneumothorax. IMPRESSION: Mild scarring right upper lobe. No edema or airspace opacity. Heart size normal. Electronically Signed   By: Bretta Bang III M.D.   On: 08/23/2020 08:08    Procedures Procedures   Medications Ordered in ED Medications  losartan (COZAAR) tablet 50 mg (50 mg Oral Given 08/23/20 0850)    ED Course  I have reviewed the triage vital signs and the nursing notes.  Pertinent labs & imaging results that were available during my care of the patient were reviewed by me and considered in my medical decision making (see chart for details).    MDM Rules/Calculators/A&P                          Patient without evidence of acute bony or deep injury to face No evidence of head injury Chest is mildly tender but no evidence of fracture or pneumothorax seen on x-Detria Cummings Patient does have known high blood pressure and renal insufficiency and has not been taking his medications. I discussed with the patient for him to take his medications and follow-up closely with his provider and he voices understanding.  Reviewed his renal function and it is 2.09 today with his last recorded renal function her system 1.77 approximately 1 year ago.  Patient given his prescription for Cozaar and discharged in improved condition. Final Clinical Impression(s) / ED Diagnoses Final diagnoses:  Abrasion of face, initial encounter  Chest pain, muscular  Hypertension, unspecified type  Renal insufficiency    Rx / DC Orders ED Discharge Orders    None       Margarita Grizzle, MD 08/23/20 1015

## 2020-08-23 NOTE — Discharge Instructions (Addendum)
Please get your medicine refilled and begin taking it Call tomorrow and make an appointment with your doctor for recheck Return to emergency department if you are worse anytime

## 2020-08-23 NOTE — ED Notes (Signed)
Patient transported to X-ray 

## 2020-08-23 NOTE — ED Triage Notes (Signed)
Pt reports headache, chest pain, "heart beating out of his chest", and unable to sleep for 2-3 days.  Abrasions noted to face.  Pt states he tripped and fell.

## 2021-05-11 IMAGING — CR DG CHEST 2V
2 series · 2 of 2 positions shown · non-contrast
Comparison: July 30, 2018

CLINICAL DATA: Chest pain

EXAM:
CHEST - 2 VIEW

[chest lat]
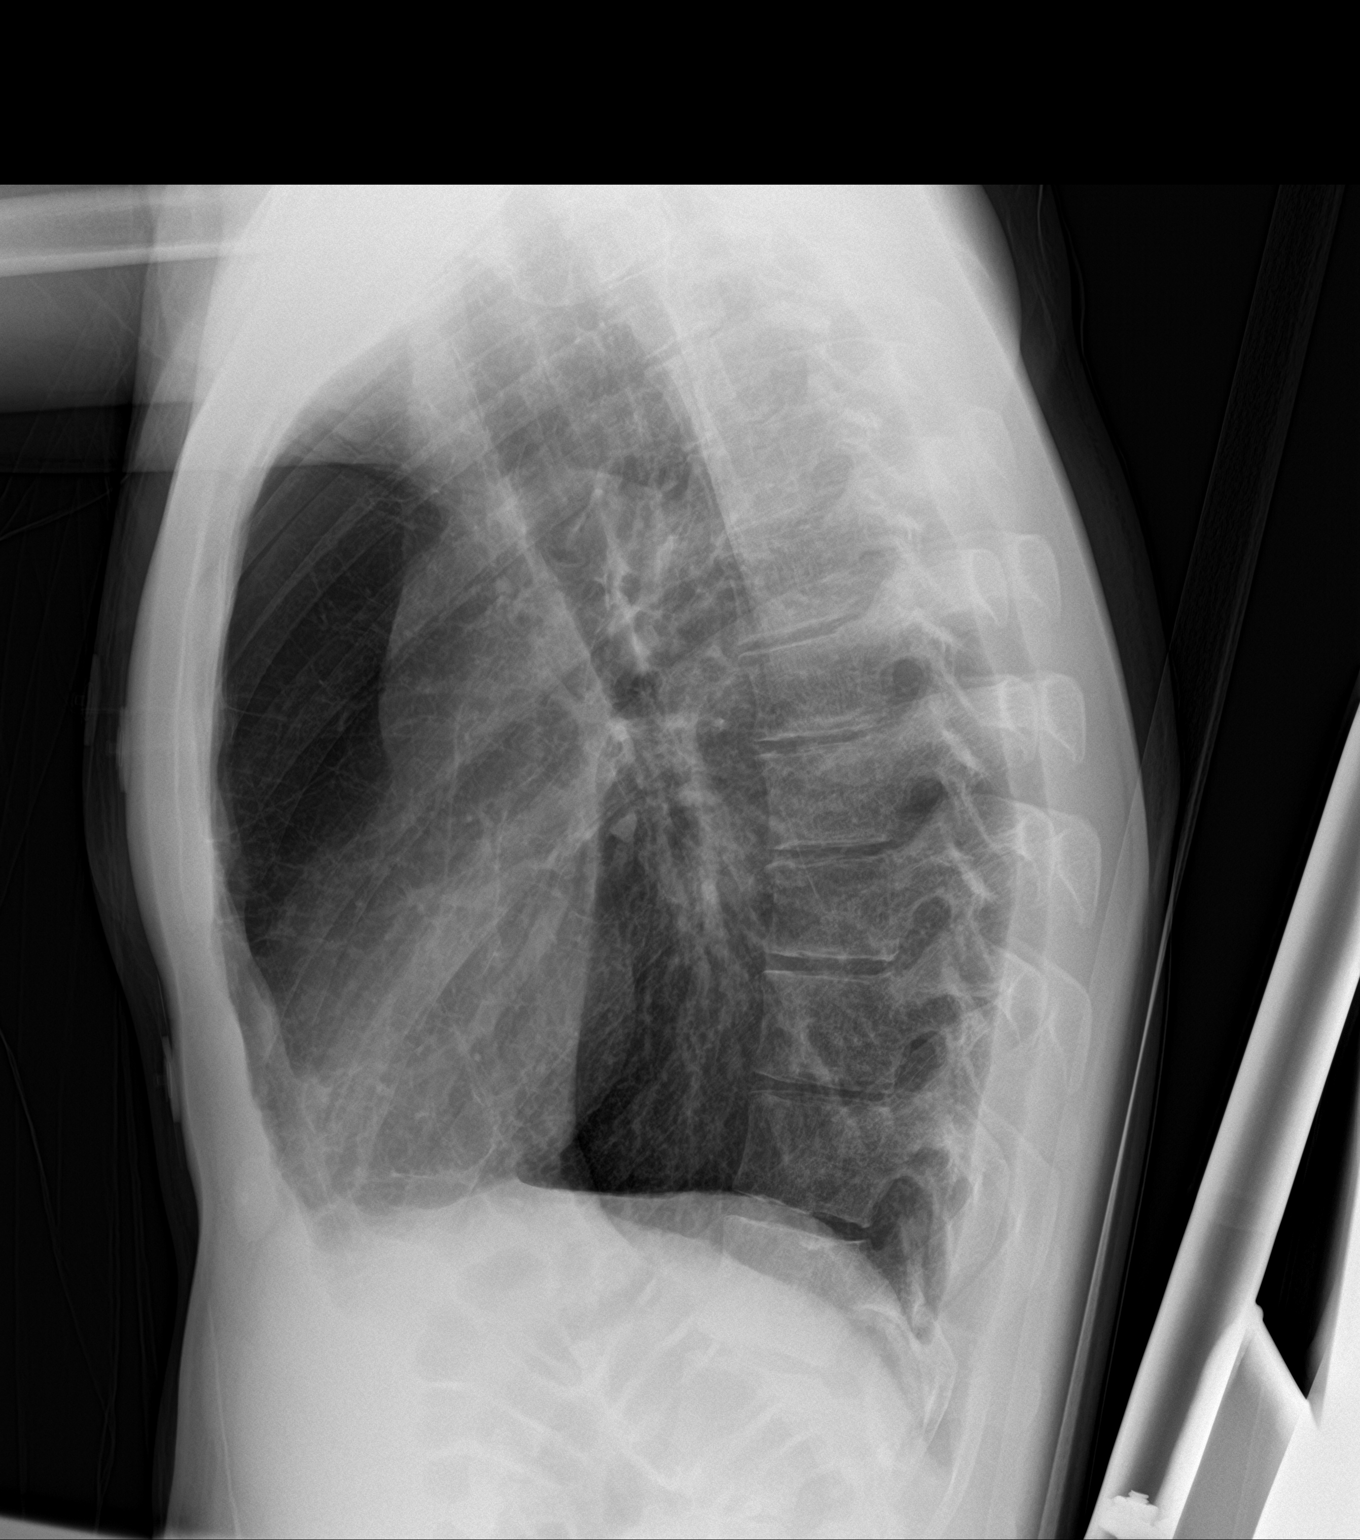

[chest ap]
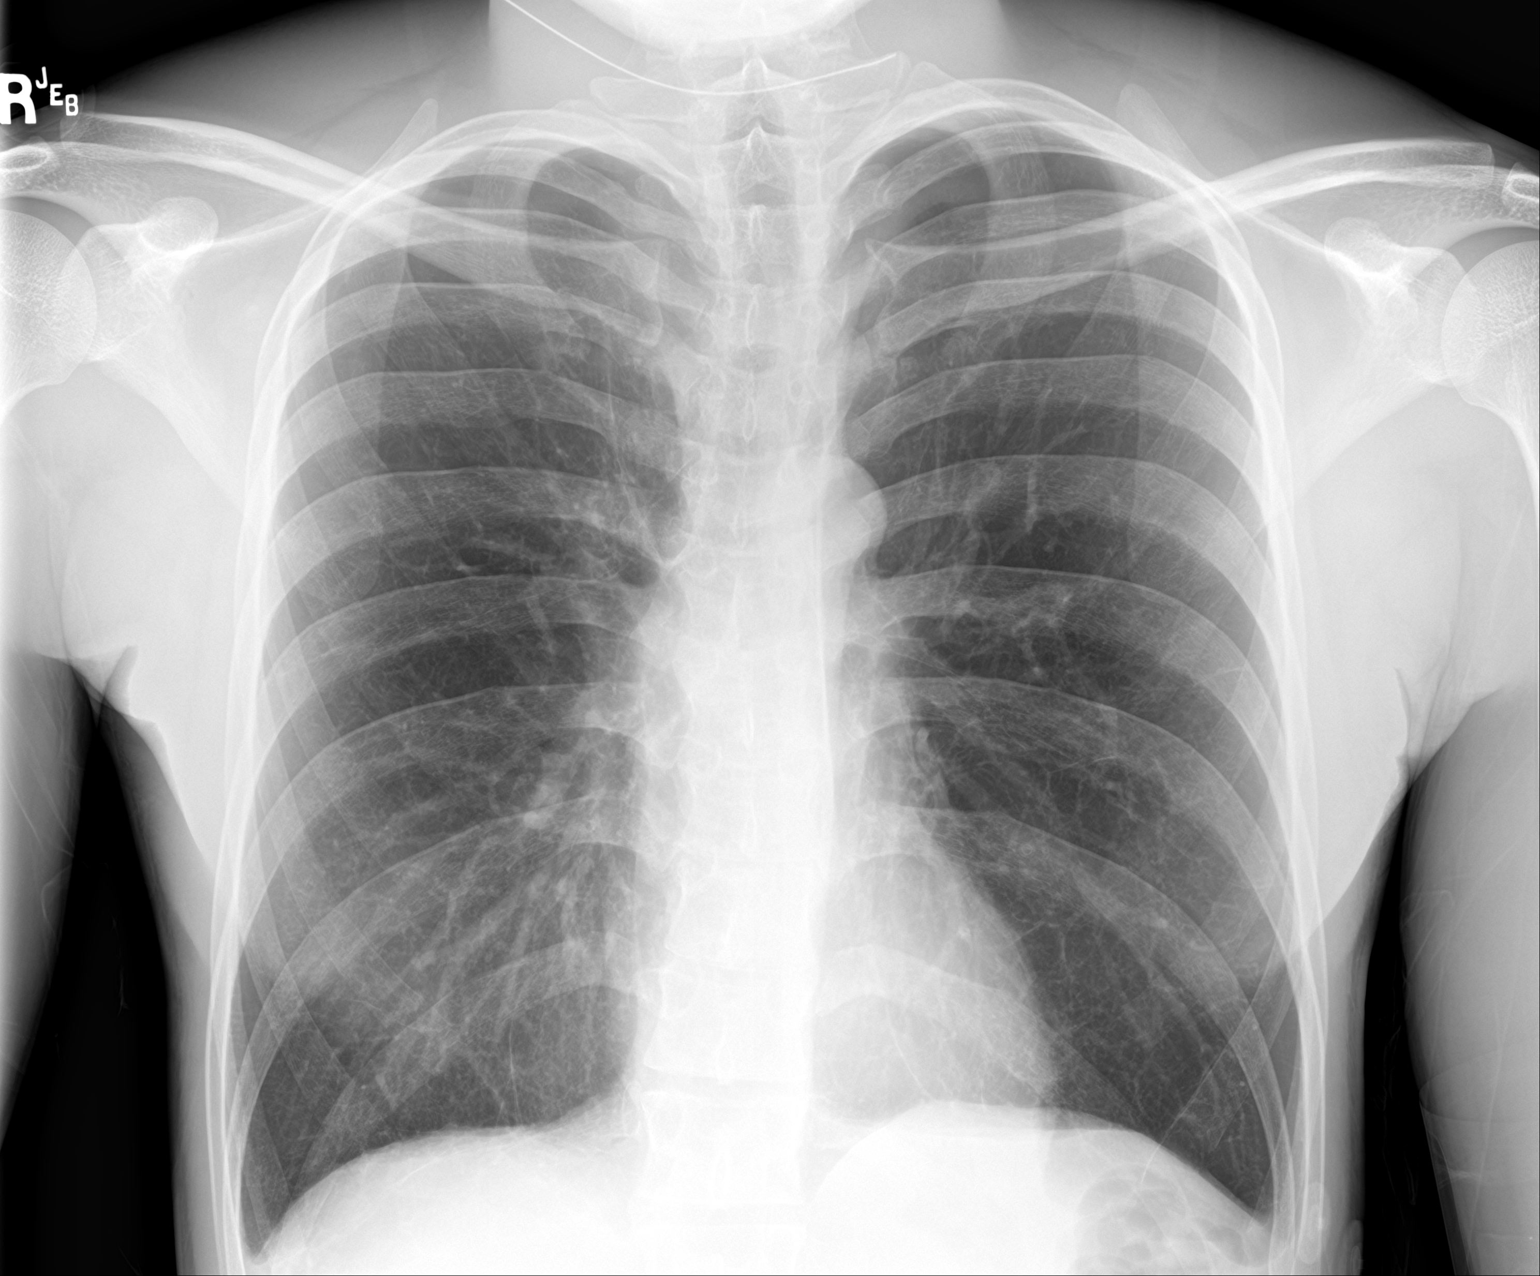

[2 of 2 positions shown; findings below may reference images not displayed]

FINDINGS: There is mild apparent scarring in the medial right upper lobe.
Lungs elsewhere are clear. Heart size and pulmonary vascularity are
normal. No adenopathy. No bone lesions. No pneumothorax.
IMPRESSION: Mild scarring right upper lobe. No edema or airspace opacity. Heart
size normal.

## 2022-01-09 ENCOUNTER — Emergency Department (HOSPITAL_COMMUNITY): Payer: Medicaid Other

## 2022-01-09 ENCOUNTER — Other Ambulatory Visit: Payer: Self-pay

## 2022-01-09 ENCOUNTER — Emergency Department (HOSPITAL_COMMUNITY)
Admission: EM | Admit: 2022-01-09 | Discharge: 2022-01-09 | Disposition: A | Discharge status: Home / Self Care | Payer: Medicaid Other | Attending: Emergency Medicine | Admitting: Emergency Medicine

## 2022-01-09 ENCOUNTER — Encounter (HOSPITAL_COMMUNITY): Payer: Self-pay

## 2022-01-09 DIAGNOSIS — R1013 Epigastric pain: Secondary | ICD-10-CM | POA: Diagnosis not present

## 2022-01-09 DIAGNOSIS — I129 Hypertensive chronic kidney disease with stage 1 through stage 4 chronic kidney disease, or unspecified chronic kidney disease: Secondary | ICD-10-CM | POA: Diagnosis not present

## 2022-01-09 DIAGNOSIS — D72829 Elevated white blood cell count, unspecified: Secondary | ICD-10-CM | POA: Insufficient documentation

## 2022-01-09 DIAGNOSIS — R7989 Other specified abnormal findings of blood chemistry: Secondary | ICD-10-CM | POA: Diagnosis not present

## 2022-01-09 DIAGNOSIS — N189 Chronic kidney disease, unspecified: Secondary | ICD-10-CM | POA: Insufficient documentation

## 2022-01-09 DIAGNOSIS — R197 Diarrhea, unspecified: Secondary | ICD-10-CM | POA: Insufficient documentation

## 2022-01-09 DIAGNOSIS — R112 Nausea with vomiting, unspecified: Secondary | ICD-10-CM

## 2022-01-09 DIAGNOSIS — Z79899 Other long term (current) drug therapy: Secondary | ICD-10-CM | POA: Diagnosis not present

## 2022-01-09 DIAGNOSIS — I10 Essential (primary) hypertension: Secondary | ICD-10-CM

## 2022-01-09 DIAGNOSIS — R1010 Upper abdominal pain, unspecified: Secondary | ICD-10-CM

## 2022-01-09 LAB — COMPREHENSIVE METABOLIC PANEL
ALT: 33 U/L (ref 0–44)
AST: 31 U/L (ref 15–41)
Albumin: 4.1 g/dL (ref 3.5–5.0)
Alkaline Phosphatase: 101 U/L (ref 38–126)
Anion gap: 6 (ref 5–15)
BUN: 19 mg/dL (ref 6–20)
CO2: 22 mmol/L (ref 22–32)
Calcium: 9.5 mg/dL (ref 8.9–10.3)
Chloride: 112 mmol/L — ABNORMAL HIGH (ref 98–111)
Creatinine, Ser: 1.86 mg/dL — ABNORMAL HIGH (ref 0.61–1.24)
GFR, Estimated: 51 mL/min — ABNORMAL LOW (ref >60)
Glucose, Bld: 90 mg/dL (ref 70–99)
Potassium: 4.9 mmol/L (ref 3.5–5.1)
Sodium: 140 mmol/L (ref 135–145)
Total Bilirubin: 0.9 mg/dL (ref 0.3–1.2)
Total Protein: 7.8 g/dL (ref 6.5–8.1)

## 2022-01-09 LAB — URINALYSIS, ROUTINE W REFLEX MICROSCOPIC
Bacteria, UA: NONE SEEN
Bilirubin Urine: NEGATIVE
Glucose, UA: NEGATIVE mg/dL
Ketones, ur: NEGATIVE mg/dL
Leukocytes,Ua: NEGATIVE
Nitrite: NEGATIVE
Protein, ur: >=300 mg/dL — AB
Specific Gravity, Urine: 1.012 (ref 1.005–1.030)
pH: 5 (ref 5.0–8.0)

## 2022-01-09 LAB — CBC
HCT: 55.9 % — ABNORMAL HIGH (ref 39.0–52.0)
Hemoglobin: 19.1 g/dL — ABNORMAL HIGH (ref 13.0–17.0)
MCH: 30.6 pg (ref 26.0–34.0)
MCHC: 34.2 g/dL (ref 30.0–36.0)
MCV: 89.4 fL (ref 80.0–100.0)
Platelets: 247 10*3/uL (ref 150–400)
RBC: 6.25 MIL/uL — ABNORMAL HIGH (ref 4.22–5.81)
RDW: 13.4 % (ref 11.5–15.5)
WBC: 5 10*3/uL (ref 4.0–10.5)
nRBC: 0 % (ref 0.0–0.2)

## 2022-01-09 LAB — LIPASE, BLOOD: Lipase: 43 U/L (ref 11–51)

## 2022-01-09 MED ORDER — ONDANSETRON HCL 4 MG PO TABS: 4.0000 mg | ORAL_TABLET | Freq: Three times a day (TID) | ORAL | 0 refills | Status: AC | PRN

## 2022-01-09 NOTE — Discharge Instructions (Addendum)
You were seen in the emergency department for upper abdominal pain nausea vomiting diarrhea.  Your blood pressure was also very elevated here.  You had lab work done along with a right upper quadrant ultrasound that did not show any obvious abnormalities.  Your kidney function was elevated but stable.  Will be important for you to contact Dr. Allena Katz and get restarted on your blood pressure medication.  We are prescribing some nausea medication.  Please start with a clear liquid diet and advance as tolerated.  Return if any fever or worsening symptoms.

## 2022-01-09 NOTE — ED Provider Triage Note (Signed)
Emergency Medicine Provider Triage Evaluation Note  [name] , a 26 y.o. male  was evaluated in triage.  Pt complains of 5 ep of vomiting over 3 days associated with diarrhea (a few) loose (not water) no blood in emesis or BM.  No fever.   Review of Systems  Positive: Abd pain, NVD Negative: Fever   Physical Exam  BP (!) 200/126 (BP Location: Left Arm)   Pulse 96   Temp 97.7 F (36.5 C) (Oral)   Resp 20   Ht 5\' 11"  (1.803 m)   Wt 61.2 kg   SpO2 98%   BMI 18.83 kg/m  Gen:   Awake, no distress   Resp:  Normal effort  MSK:   Moves extremities without difficulty  Other:  RUQ w MILD ttp. No guarding  Medical Decision Making  Medically screening exam initiated at 2:35 PM.  Appropriate orders placed.  Bharath Bernstein was informed that the remainder of the evaluation will be completed by another provider, this initial triage assessment does not replace that evaluation, and the importance of remaining in the ED until their evaluation is complete.  Labs, RUQ ultrasound.    Kreg Shropshire [place], DOLE 01/09/22 1436

## 2022-01-09 NOTE — ED Provider Notes (Signed)
[facility]-EMERGENCY DEPT Provider Note   CSN: [id number] Arrival date & time: 01/09/22  1313     History  Chief Complaint  Patient presents with   Abdominal Pain   Diarrhea    [name] is a 26 y.o. male.  He has no significant past medical history.  He is complaining of some upper abdominal pain associated with headache nausea and vomiting with a little bit of diarrhea its been going on for 2 days.  No fevers or chills.  No sick contacts or recent travel.  He had to leave work today due to same.  No blood from above or below.  He said he has had this before.  The history is provided by the patient.  Abdominal Pain Pain location:  Epigastric Pain quality: cramping   Pain radiates to:  Does not radiate Pain severity:  Moderate Onset quality:  Gradual Duration:  2 days Timing:  Intermittent Progression:  Unchanged Chronicity:  Recurrent Context: not recent travel, not sick contacts, not suspicious food intake and not trauma   Relieved by:  None tried Worsened by:  Nothing Ineffective treatments:  None tried Associated symptoms: diarrhea, nausea and vomiting   Associated symptoms: no chest pain, no cough, no fever, no hematuria, no shortness of breath and no sore throat        Home Medications Prior to Admission medications   Medication Sig Start Date End Date Taking? Authorizing Provider  ondansetron (ZOFRAN) 4 MG tablet Take 1 tablet (4 mg total) by mouth every 8 (eight) hours as needed for nausea or vomiting. 01/09/22  Yes Terrilee Files, MD  losartan (COZAAR) 50 MG tablet Take 1 tablet (50 mg total) by mouth daily. 08/23/20   Margarita Grizzle, MD  Vitamin D, Ergocalciferol, (DRISDOL) 50000 units CAPS capsule Take 1 capsule by mouth once a week. 03/04/17   [provider]      Allergies    Amoxicillin    Review of Systems   Review of Systems  Constitutional:  Negative for fever.  HENT:  Negative for sore throat.   Eyes:  Negative  for visual disturbance.  Respiratory:  Negative for cough and shortness of breath.   Cardiovascular:  Negative for chest pain.  Gastrointestinal:  Positive for abdominal pain, diarrhea, nausea and vomiting.  Genitourinary:  Negative for hematuria.  Skin:  Negative for rash.  Neurological:  Positive for headaches.    Physical Exam Updated Vital Signs BP (!) 200/126 (BP Location: Left Arm)   Pulse 96   Temp 97.7 F (36.5 C) (Oral)   Resp 20   Ht 5\' 11"  (1.803 m)   Wt 61.2 kg   SpO2 98%   BMI 18.83 kg/m  Physical Exam Vitals and nursing note reviewed.  Constitutional:      General: He is not in acute distress.    Appearance: He is well-developed.  HENT:     Head: Normocephalic and atraumatic.  Eyes:     Conjunctiva/sclera: Conjunctivae normal.  Cardiovascular:     Rate and Rhythm: Normal rate and regular rhythm.     Heart sounds: No murmur heard. Pulmonary:     Effort: Pulmonary effort is normal. No respiratory distress.     Breath sounds: Normal breath sounds.  Abdominal:     Palpations: Abdomen is soft.     Tenderness: There is no abdominal tenderness. There is no guarding or rebound.  Musculoskeletal:        General: No swelling.  Cervical back: Neck supple.  Skin:    General: Skin is warm and dry.     Capillary Refill: Capillary refill takes less than 2 seconds.  Neurological:     General: No focal deficit present.     Mental Status: He is alert.     ED Results / Procedures / Treatments   Labs (all labs ordered are listed, but only abnormal results are displayed) Labs Reviewed  COMPREHENSIVE METABOLIC PANEL - Abnormal; Notable for the following components:      Result Value   Chloride 112 (*)    Creatinine, Ser 1.86 (*)    GFR, Estimated 51 (*)    All other components within normal limits  CBC - Abnormal; Notable for the following components:   RBC 6.25 (*)    Hemoglobin 19.1 (*)    HCT 55.9 (*)    All other components within normal limits   URINALYSIS, ROUTINE W REFLEX MICROSCOPIC - Abnormal; Notable for the following components:   APPearance HAZY (*)    Hgb urine dipstick SMALL (*)    Protein, ur >=300 (*)    All other components within normal limits  LIPASE, BLOOD    EKG None  Radiology US Abdomen Limited RUQ (LIVER/GB)  Result Date: 01/09/2022 CLINICAL DATA:  Right upper quadrant pain for 3 days. EXAM: ULTRASOUND ABDOMEN LIMITED RIGHT UPPER QUADRANT COMPARISON:  CT, 08/19/2019. FINDINGS: Gallbladder: No gallstones or wall thickening visualized. No sonographic Murphy sign noted by sonographer. Common bile duct: Diameter: 4 mm Liver: No focal lesion identified. Within normal limits in parenchymal echogenicity. Portal vein is patent on color Doppler imaging with normal direction of blood flow towards the liver. Other: Incidental note of a hyperechoic right kidney. IMPRESSION: 1. No acute findings.  Normal gallbladder.  No bile duct dilation. 2. Hyperechoic right kidney suggesting medical renal disease. Electronically Signed   By: Amie Portland M.D.   On: 01/09/2022 16:35    Procedures Procedures    Medications Ordered in ED Medications - No data to display  ED Course/ Medical Decision Making/ A&P Clinical Course as of 01/10/22 1132  Sun Jan 09, 2022  1805 Patient's blood pressure elevated here.  He is otherwise nontoxic-appearing.  He has a history of hypertension and he said he was taken off the losartan.  He does not know the name of his new medication and has not been taking it.  He said he will follow-up with Dr. Allena Katz to get restarted on it. [MB]    Clinical Course User Index [MB] Terrilee Files, MD                           Medical Decision Making Amount and/or Complexity of Data Reviewed Labs: ordered.  Risk Prescription drug management.  This patient complains of abdominal pain nausea vomiting diarrhea; this involves an extensive number of treatment Options and is a complaint that carries with it a  high risk of complications and morbidity. The differential includes gastroenteritis, peptic ulcer disease, GERD, cholelithiasis, cholecystitis, renal failure  I ordered, reviewed and interpreted labs, which included CBC with normal white count elevated hemoglobin, chemistries normal other than elevated creatinine stable from priors, normal LFTs and lipase  I ordered imaging studies which included right upper quadrant ultrasound and I independently    visualized and interpreted imaging which showed no acute findings  Previous records obtained and reviewed in epic, patient is multiple prior visits for abdominal pain nausea vomiting Social  determinants considered, no significant barriers Critical Interventions: None  After the interventions stated above, I reevaluated the patient and found patient to be well-appearing tolerating p.o. and nontoxic Admission and further testing considered, no indications for admission or further testing at this time.  Blood pressure remains markedly elevated.  Patient states he has not taken his blood pressure in a while.  Counseled patient he needs to follow-up with his treating provider to get restarted on his blood pressure medication as he does not know the name of the medication.          Final Clinical Impression(s) / ED Diagnoses Final diagnoses:  Nausea vomiting and diarrhea  Upper abdominal pain  Hypertension, unspecified type  Chronic kidney disease, unspecified CKD stage    Rx / DC Orders ED Discharge Orders          Ordered    ondansetron (ZOFRAN) 4 MG tablet  Every 8 hours PRN        01/09/22 1758              Hayden Rasmussen, MD 01/10/22 1134

## 2022-01-09 NOTE — ED Triage Notes (Signed)
Patient c/o mid abdominal pain, headache, N/v/d x 2 days.

## 2022-02-02 ENCOUNTER — Encounter (HOSPITAL_COMMUNITY): Payer: Self-pay

## 2022-02-02 ENCOUNTER — Emergency Department (HOSPITAL_COMMUNITY)
Admission: EM | Admit: 2022-02-02 | Discharge: 2022-02-02 | Disposition: A | Discharge status: Home / Self Care | Payer: Medicaid Other | Attending: Emergency Medicine | Admitting: Emergency Medicine

## 2022-02-02 ENCOUNTER — Other Ambulatory Visit: Payer: Self-pay

## 2022-02-02 DIAGNOSIS — R197 Diarrhea, unspecified: Secondary | ICD-10-CM | POA: Diagnosis not present

## 2022-02-02 DIAGNOSIS — R112 Nausea with vomiting, unspecified: Secondary | ICD-10-CM | POA: Insufficient documentation

## 2022-02-02 LAB — URINALYSIS, ROUTINE W REFLEX MICROSCOPIC
Bilirubin Urine: NEGATIVE
Glucose, UA: NEGATIVE mg/dL
Ketones, ur: NEGATIVE mg/dL
Leukocytes,Ua: NEGATIVE
Nitrite: NEGATIVE
Protein, ur: >=300 mg/dL — AB
Specific Gravity, Urine: 1.018 (ref 1.005–1.030)
pH: 5 (ref 5.0–8.0)

## 2022-02-02 LAB — CBC WITH DIFFERENTIAL/PLATELET
Abs Immature Granulocytes: 0 10*3/uL (ref 0.00–0.07)
Basophils Absolute: 0 10*3/uL (ref 0.0–0.1)
Basophils Relative: 1 %
Eosinophils Absolute: 0.2 10*3/uL (ref 0.0–0.5)
Eosinophils Relative: 3 %
HCT: 59.3 % — ABNORMAL HIGH (ref 39.0–52.0)
Hemoglobin: 20.8 g/dL — ABNORMAL HIGH (ref 13.0–17.0)
Immature Granulocytes: 0 %
Lymphocytes Relative: 25 %
Lymphs Abs: 1.4 10*3/uL (ref 0.7–4.0)
MCH: 29.6 pg (ref 26.0–34.0)
MCHC: 35.1 g/dL (ref 30.0–36.0)
MCV: 84.5 fL (ref 80.0–100.0)
Monocytes Absolute: 0.9 10*3/uL (ref 0.1–1.0)
Monocytes Relative: 16 %
Neutro Abs: 3 10*3/uL (ref 1.7–7.7)
Neutrophils Relative %: 55 %
Platelets: 244 10*3/uL (ref 150–400)
RBC: 7.02 MIL/uL — ABNORMAL HIGH (ref 4.22–5.81)
RDW: 13.8 % (ref 11.5–15.5)
WBC: 5.4 10*3/uL (ref 4.0–10.5)
nRBC: 0 % (ref 0.0–0.2)

## 2022-02-02 LAB — COMPREHENSIVE METABOLIC PANEL
ALT: 37 U/L (ref 0–44)
AST: 33 U/L (ref 15–41)
Albumin: 3.6 g/dL (ref 3.5–5.0)
Alkaline Phosphatase: 119 U/L (ref 38–126)
Anion gap: 11 (ref 5–15)
BUN: 25 mg/dL — ABNORMAL HIGH (ref 6–20)
CO2: 19 mmol/L — ABNORMAL LOW (ref 22–32)
Calcium: 9.2 mg/dL (ref 8.9–10.3)
Chloride: 105 mmol/L (ref 98–111)
Creatinine, Ser: 2.36 mg/dL — ABNORMAL HIGH (ref 0.61–1.24)
GFR, Estimated: 38 mL/min — ABNORMAL LOW (ref >60)
Glucose, Bld: 135 mg/dL — ABNORMAL HIGH (ref 70–99)
Potassium: 4 mmol/L (ref 3.5–5.1)
Sodium: 135 mmol/L (ref 135–145)
Total Bilirubin: 0.6 mg/dL (ref 0.3–1.2)
Total Protein: 8 g/dL (ref 6.5–8.1)

## 2022-02-02 LAB — LIPASE, BLOOD: Lipase: 34 U/L (ref 11–51)

## 2022-02-02 MED ORDER — PROMETHAZINE HCL 25 MG RE SUPP: 25.0000 mg | Freq: Four times a day (QID) | RECTAL | 0 refills | Status: AC | PRN

## 2022-02-02 MED ORDER — DIPHENHYDRAMINE HCL 50 MG/ML IJ SOLN
25.0000 mg | Freq: Once | INTRAMUSCULAR | Status: AC
  Administered 2022-02-02: 25 mg via INTRAVENOUS
  Filled 2022-02-02: qty 1

## 2022-02-02 MED ORDER — DROPERIDOL 2.5 MG/ML IJ SOLN
1.2500 mg | Freq: Once | INTRAMUSCULAR | Status: AC
  Administered 2022-02-02: 1.25 mg via INTRAVENOUS
  Filled 2022-02-02: qty 2

## 2022-02-02 MED ORDER — SODIUM CHLORIDE 0.9 % IV BOLUS
1000.0000 mL | Freq: Once | INTRAVENOUS | Status: AC
  Administered 2022-02-02: 1000 mL via INTRAVENOUS

## 2022-02-02 MED ORDER — MORPHINE SULFATE (PF) 4 MG/ML IV SOLN
4.0000 mg | Freq: Once | INTRAVENOUS | Status: AC
  Administered 2022-02-02: 4 mg via INTRAVENOUS
  Filled 2022-02-02: qty 1

## 2022-02-02 MED ORDER — ONDANSETRON 4 MG PO TBDP: ORAL_TABLET | ORAL | 0 refills | Status: DC

## 2022-02-02 NOTE — ED Notes (Signed)
Pt A&Ox4 ambulatory at d/c with independent steady gait. Pt verbalized understanding of d/c instructions, prescriptions and follow up care. 

## 2022-02-02 NOTE — ED Provider Triage Note (Signed)
Emergency Medicine Provider Triage Evaluation Note  Maxtyn Nuzum , a 26 y.o. male  was evaluated in triage.  Pt complains of nausea, vomiting, diarrhea, and abdominal pain ongoing since Saturday.  States he was able to keep a bit of food down last night.  Denies fever, chills.  Unsure of the inciting factor.  Review of Systems  Positive: As above Negative: As above  Physical Exam  BP 119/83   Pulse 89   Temp 98.7 F (37.1 C)   Resp 18   Ht 5\' 11"  (1.803 m)   Wt 63.5 kg   SpO2 100%   BMI 19.53 kg/m  Gen:   Awake, no distress   Resp:  Normal effort  MSK:   Moves extremities without difficulty  Other:    Medical Decision Making  Medically screening exam initiated at 1:14 PM.  Appropriate orders placed.  Ziah Leandro was informed that the remainder of the evaluation will be completed by another provider, this initial triage assessment does not replace that evaluation, and the importance of remaining in the ED until their evaluation is complete.     Kreg Shropshire, PA-C 02/02/22 1314

## 2022-02-02 NOTE — Discharge Instructions (Signed)
Please return for worsening abdominal pain or inability eat or drink.  I have given you information to follow-up with a gastroenterologist if you so choose.  Please follow-up with your family doctor.

## 2022-02-02 NOTE — ED Triage Notes (Signed)
Pt c/o abdominal pain, nausea, vomiting, and diarrhea since Saturday.

## 2022-02-02 NOTE — ED Provider Notes (Signed)
[facility]-EMERGENCY DEPT Provider Note   CSN: [id number] Arrival date & time: 02/02/22  1240     History  Chief Complaint  Patient presents with   Nausea   Abdominal Pain    [name] is a 26 y.o. male.  26 yo M with a chief complaints of nausea vomiting and diarrhea.  This been going on for a few days now.  The patient unfortunately has had this occur to him frequently.  Has had multiple visits to the emergency department in the past.  He thinks this feels similar to the past.  Denies new areas of pain denies fevers.  Denies suspicious food intake.   Abdominal Pain      Home Medications Prior to Admission medications   Medication Sig Start Date End Date Taking? Authorizing Provider  ondansetron (ZOFRAN-ODT) 4 MG disintegrating tablet 4mg  ODT q4 hours prn nausea/vomit 02/02/22  Yes 02/04/22, DO  promethazine (PHENERGAN) 25 MG suppository Place 1 suppository (25 mg total) rectally every 6 (six) hours as needed for nausea or vomiting. 02/02/22  Yes 02/04/22, DO  losartan (COZAAR) 50 MG tablet Take 1 tablet (50 mg total) by mouth daily. 08/23/20   08/25/20, MD  ondansetron (ZOFRAN) 4 MG tablet Take 1 tablet (4 mg total) by mouth every 8 (eight) hours as needed for nausea or vomiting. 01/09/22   03/12/22, MD  Vitamin D, Ergocalciferol, (DRISDOL) 50000 units CAPS capsule Take 1 capsule by mouth once a week. 03/04/17   [provider]      Allergies    Amoxicillin    Review of Systems   Review of Systems  Gastrointestinal:  Positive for abdominal pain.    Physical Exam Updated Vital Signs BP 129/90   Pulse 67   Temp 98.8 F (37.1 C) (Oral)   Resp 17   Ht 5\' 11"  (1.803 m)   Wt 63.5 kg   SpO2 95%   BMI 19.53 kg/m  Physical Exam Vitals and nursing note reviewed.  Constitutional:      Appearance: He is well-developed.  HENT:     Head: Normocephalic and atraumatic.  Eyes:     Pupils: Pupils are equal, round, and  reactive to light.  Neck:     Vascular: No JVD.  Cardiovascular:     Rate and Rhythm: Normal rate and regular rhythm.     Heart sounds: No murmur heard.    No friction rub. No gallop.  Pulmonary:     Effort: No respiratory distress.     Breath sounds: No wheezing.  Abdominal:     General: There is no distension.     Tenderness: There is abdominal tenderness (mild diffuse). There is no guarding or rebound.  Musculoskeletal:        General: Normal range of motion.     Cervical back: Normal range of motion and neck supple.  Skin:    Coloration: Skin is not pale.     Findings: No rash.  Neurological:     Mental Status: He is alert and oriented to person, place, and time.  Psychiatric:        Behavior: Behavior normal.     ED Results / Procedures / Treatments   Labs (all labs ordered are listed, but only abnormal results are displayed) Labs Reviewed  COMPREHENSIVE METABOLIC PANEL - Abnormal; Notable for the following components:      Result Value   CO2 19 (*)    Glucose, Bld 135 (*)  BUN 25 (*)    Creatinine, Ser 2.36 (*)    GFR, Estimated 38 (*)    All other components within normal limits  URINALYSIS, ROUTINE W REFLEX MICROSCOPIC - Abnormal; Notable for the following components:   APPearance HAZY (*)    Hgb urine dipstick SMALL (*)    Protein, ur >=300 (*)    Bacteria, UA RARE (*)    All other components within normal limits  CBC WITH DIFFERENTIAL/PLATELET - Abnormal; Notable for the following components:   RBC 7.02 (*)    Hemoglobin 20.8 (*)    HCT 59.3 (*)    All other components within normal limits  LIPASE, BLOOD    EKG None  Radiology No results found.  Procedures Procedures    Medications Ordered in ED Medications  droperidol (INAPSINE) 2.5 MG/ML injection 1.25 mg (1.25 mg Intravenous Given 02/02/22 1832)  diphenhydrAMINE (BENADRYL) injection 25 mg (25 mg Intravenous Given 02/02/22 1833)  sodium chloride 0.9 % bolus 1,000 mL (0 mLs Intravenous  Stopped 02/02/22 1937)  morphine (PF) 4 MG/ML injection 4 mg (4 mg Intravenous Given 02/02/22 1834)    ED Course/ Medical Decision Making/ A&P                           Medical Decision Making Amount and/or Complexity of Data Reviewed Labs: ordered.  Risk Prescription drug management.   26 yo M with a chief complaints of abdominal pain nausea vomiting and diarrhea.  This is a recurrent problem for him.  Blood work here without LFT elevation lipase is negative no significant leukocytosis.  We will treat his symptoms here.  Bolus of IV fluids.  Reassess.  Patient feeling quite a bit better on reassessment.  Would like to go home.  Given GI follow-up as this has been a recurrent issue.  10:24 PM:  I have discussed the diagnosis/risks/treatment options with the patient.  Evaluation and diagnostic testing in the emergency department does not suggest an emergent condition requiring admission or immediate intervention beyond what has been performed at this time.  They will follow up with  PCP. We also discussed returning to the ED immediately if new or worsening sx occur. We discussed the sx which are most concerning (e.g., sudden worsening pain, fever, inability to tolerate by mouth) that necessitate immediate return. Medications administered to the patient during their visit and any new prescriptions provided to the patient are listed below.  Medications given during this visit Medications  droperidol (INAPSINE) 2.5 MG/ML injection 1.25 mg (1.25 mg Intravenous Given 02/02/22 1832)  diphenhydrAMINE (BENADRYL) injection 25 mg (25 mg Intravenous Given 02/02/22 1833)  sodium chloride 0.9 % bolus 1,000 mL (0 mLs Intravenous Stopped 02/02/22 1937)  morphine (PF) 4 MG/ML injection 4 mg (4 mg Intravenous Given 02/02/22 1834)     The patient appears reasonably screen and/or stabilized for discharge and I doubt any other medical condition or other [facility] requiring further screening, evaluation, or treatment in  the ED at this time prior to discharge.          Final Clinical Impression(s) / ED Diagnoses Final diagnoses:  Nausea vomiting and diarrhea    Rx / DC Orders ED Discharge Orders          Ordered    ondansetron (ZOFRAN-ODT) 4 MG disintegrating tablet        02/02/22 1931    promethazine (PHENERGAN) 25 MG suppository  Every 6 hours PRN  02/02/22 [place], [place], DO 02/02/22 2224

## 2023-09-23 ENCOUNTER — Ambulatory Visit
Admission: EM | Admit: 2023-09-23 | Discharge: 2023-09-23 | Disposition: A | Discharge status: Home / Self Care | Payer: MEDICAID | Attending: Internal Medicine | Admitting: Internal Medicine

## 2023-09-23 DIAGNOSIS — K529 Noninfective gastroenteritis and colitis, unspecified: Secondary | ICD-10-CM

## 2023-09-23 DIAGNOSIS — R1084 Generalized abdominal pain: Secondary | ICD-10-CM | POA: Diagnosis not present

## 2023-09-23 DIAGNOSIS — R112 Nausea with vomiting, unspecified: Secondary | ICD-10-CM | POA: Diagnosis not present

## 2023-09-23 MED ORDER — ONDANSETRON 4 MG PO TBDP
4.0000 mg | ORAL_TABLET | Freq: Once | ORAL | Status: AC
  Administered 2023-09-23: 4 mg via ORAL

## 2023-09-23 MED ORDER — ONDANSETRON 4 MG PO TBDP: 4.0000 mg | ORAL_TABLET | Freq: Three times a day (TID) | ORAL | 0 refills | Status: AC | PRN

## 2023-09-23 MED ORDER — CYCLOBENZAPRINE HCL 5 MG PO TABS: 5.0000 mg | ORAL_TABLET | Freq: Three times a day (TID) | ORAL | 0 refills | Status: AC | PRN

## 2023-09-23 NOTE — ED Provider Notes (Signed)
 [name]    CSN: [id number] Arrival date & time: 09/23/23  1514      History   Chief Complaint Chief Complaint  Patient presents with   Abdominal Pain   Emesis   Headache    HPI [name] is a 28 y.o. male.   28 year old male who presents urgent care with complaints of nausea, vomiting and abdominal pain.  He reports that on Tuesday he started throwing up then he started having some abdominal pain with headaches.  The symptoms have persisted.  He reports that he had a vomiting episode at 4:00 this morning and has had multiple episodes of dry heaving since then.  His abdominal pain has been worse since he was dry heaving.  He has been able to keep down electrolyte drinks but is not able to eat much.  He has been urinating.  He has felt very hot this week but has not taken his temperature.  He has also had chills.  He works at [employer] and has likely had sick contacts but is unsure completely.  Several years ago he had about a year of emergency room visit secondary to nausea, vomiting and abdominal pain.  This was in 2023.  He reports that at that time he was having a rough time in his life.  He reports that this seems to be different.   Abdominal Pain Associated symptoms: nausea and vomiting   Associated symptoms: no chest pain, no chills, no cough, no dysuria, no fever, no hematuria, no shortness of breath and no sore throat   Emesis Associated symptoms: abdominal pain and headaches   Associated symptoms: no arthralgias, no chills, no cough, no fever and no sore throat   Headache Associated symptoms: abdominal pain, nausea and vomiting   Associated symptoms: no back pain, no cough, no ear pain, no eye pain, no fever, no seizures and no sore throat     Past Medical History:  Diagnosis Date   Asthma    Renal disorder     Patient Active Problem List   Diagnosis Date Noted   Hypertension 01/20/2015   ASD (atrial septal defect), ostium secundum  10/20/2011    History reviewed. No pertinent surgical history.     Home Medications    Prior to Admission medications   Medication Sig Start Date End Date Taking? Authorizing Provider  losartan (COZAAR) 50 MG tablet Take 1 tablet (50 mg total) by mouth daily. 08/23/20  Yes Margarita Grizzle, MD  ondansetron (ZOFRAN) 4 MG tablet Take 1 tablet (4 mg total) by mouth every 8 (eight) hours as needed for nausea or vomiting. 01/09/22   Terrilee Files, MD  ondansetron (ZOFRAN-ODT) 4 MG disintegrating tablet 4mg  ODT q4 hours prn nausea/vomit 02/02/22   Melene Plan, DO  promethazine (PHENERGAN) 25 MG suppository Place 1 suppository (25 mg total) rectally every 6 (six) hours as needed for nausea or vomiting. 02/02/22   Melene Plan, DO  Vitamin D, Ergocalciferol, (DRISDOL) 50000 units CAPS capsule Take 1 capsule by mouth once a week. 03/04/17   [provider]    Family History Family History  Family history unknown: Yes    Social History Social History   Tobacco Use   Smoking status: Never   Smokeless tobacco: Never  Vaping Use   Vaping status: Never Used  Substance Use Topics   Alcohol use: No   Drug use: No     Allergies   Amoxicillin   Review of Systems Review of Systems  Constitutional:  Negative for chills and fever.  HENT:  Negative for ear pain and sore throat.   Eyes:  Negative for pain and visual disturbance.  Respiratory:  Negative for cough and shortness of breath.   Cardiovascular:  Negative for chest pain and palpitations.  Gastrointestinal:  Positive for abdominal pain, nausea and vomiting.  Genitourinary:  Negative for dysuria and hematuria.  Musculoskeletal:  Negative for arthralgias and back pain.  Skin:  Negative for color change and rash.  Neurological:  Positive for headaches. Negative for seizures and syncope.  All other systems reviewed and are negative.    Physical Exam Triage Vital Signs ED Triage Vitals  Encounter Vitals Group     BP  09/23/23 1533 (!) 111/58     Systolic BP Percentile --      Diastolic BP Percentile --      Pulse Rate 09/23/23 1533 79     Resp 09/23/23 1533 18     Temp 09/23/23 1533 98.3 F (36.8 C)     Temp Source 09/23/23 1533 Oral     SpO2 09/23/23 1533 96 %     Weight 09/23/23 1531 139 lb 15.9 oz (63.5 kg)     Height 09/23/23 1531 6' (1.829 m)     Head Circumference --      Peak Flow --      Pain Score 09/23/23 1525 8     Pain Loc --      Pain Education --      Exclude from Growth Chart --    No data found.  Updated Vital Signs BP (!) 111/58 (BP Location: Left Arm)   Pulse 79   Temp 98.3 F (36.8 C) (Oral)   Resp 18   Ht 6' (1.829 m)   Wt 139 lb 15.9 oz (63.5 kg)   SpO2 96%   BMI 18.99 kg/m   Visual Acuity Right Eye Distance:   Left Eye Distance:   Bilateral Distance:    Right Eye Near:   Left Eye Near:    Bilateral Near:     Physical Exam Vitals and nursing note reviewed.  Constitutional:      General: He is not in acute distress.    Appearance: He is well-developed.  HENT:     Head: Normocephalic and atraumatic.  Eyes:     Conjunctiva/sclera: Conjunctivae normal.  Cardiovascular:     Rate and Rhythm: Normal rate and regular rhythm.     Heart sounds: No murmur heard. Pulmonary:     Effort: Pulmonary effort is normal. No respiratory distress.     Breath sounds: Normal breath sounds.  Abdominal:     General: Bowel sounds are normal.     Palpations: Abdomen is soft.     Tenderness: There is generalized abdominal tenderness. There is no rebound. Negative signs include Murphy's sign.     Hernia: No hernia is present.  Musculoskeletal:        General: No swelling.     Cervical back: Neck supple.  Skin:    General: Skin is warm and dry.     Capillary Refill: Capillary refill takes less than 2 seconds.  Neurological:     Mental Status: He is alert.  Psychiatric:        Mood and Affect: Mood normal.      UC Treatments / Results  Labs (all labs ordered are  listed, but only abnormal results are displayed) Labs Reviewed - No data to display  EKG   Radiology  No results found.  Procedures Procedures (including critical care time)  Medications Ordered in UC Medications  ondansetron (ZOFRAN-ODT) disintegrating tablet 4 mg (has no administration in time range)    Initial Impression / Assessment and Plan / UC Course  I have reviewed the triage vital signs and the nursing notes.  Pertinent labs & imaging results that were available during my care of the patient were reviewed by me and considered in my medical decision making (see chart for details).     Nausea and vomiting in adult  Generalized abdominal pain  Noninfectious gastroenteritis, unspecified type   Symptoms most likely related to gastroenteritis or a viral stomach illness.  This normally will resolve on its own within several days.  We will treat with the following: Zofran 4 mg orally disintegrating tablet every 8 hours as needed for nausea.  First dose given at 4:00 today.  Next dose will be due around 12 AM tonight Flexeril 5 mg every 8 hours as needed for muscle spasms.  Use caution as this medication can cause drowsiness Rest and stay hydrated.  Drink plenty of liquids even if you do not feel like taking in solid foods. Return to urgent care or PCP if symptoms worsen or fail to resolve.    Final Clinical Impressions(s) / UC Diagnoses   Final diagnoses:  None   Discharge Instructions   None    ED Prescriptions   None    PDMP not reviewed this encounter.   Landis Martins, [place] 09/23/23 1630

## 2023-09-23 NOTE — Discharge Instructions (Addendum)
 Symptoms most likely related to gastroenteritis or a viral stomach illness.  This normally will resolve on its own within several days.  We will treat with the following: Zofran 4 mg orally disintegrating tablet every 8 hours as needed for nausea.  First dose given at 4:00 today.  Next dose will be due around 12 AM tonight Flexeril 5 mg every 8 hours as needed for muscle spasms.  Use caution as this medication can cause drowsiness Rest and stay hydrated.  Drink plenty of liquids even if you do not feel like taking in solid foods. Return to urgent care, emergency room or PCP if symptoms worsen or fail to resolve.

## 2023-09-23 NOTE — ED Triage Notes (Signed)
 Here with Friend. "I started throwing up on Tuesday by Wednesday with stomach pain and ha's, by Thursday returned to work and a lot less energy, then cough started with ha recurrent. On Friday those symptoms remained on/off, abd pain on/off again with vomiting that night during night (few times) & this is remained the same through today/now". No runny nose "stopped up though". "Maybe cough". No new/unexplained Cough. Last emesis "this morning around 0400". Voids "normal". Stools "normal, not loose".
